# Patient Record
Sex: Male | Born: 1955
Health system: Southern US, Community
[De-identification: ages and names within clinical notes are randomized; demographics above are authoritative.]

## PROBLEM LIST (undated history)

## (undated) DIAGNOSIS — R071 Chest pain on breathing: Secondary | ICD-10-CM

## (undated) DIAGNOSIS — I351 Nonrheumatic aortic (valve) insufficiency: Secondary | ICD-10-CM

## (undated) DIAGNOSIS — N529 Male erectile dysfunction, unspecified: Secondary | ICD-10-CM

## (undated) DIAGNOSIS — E041 Nontoxic single thyroid nodule: Secondary | ICD-10-CM

## (undated) DIAGNOSIS — H6980 Other specified disorders of Eustachian tube, unspecified ear: Secondary | ICD-10-CM

## (undated) DIAGNOSIS — E039 Hypothyroidism, unspecified: Secondary | ICD-10-CM

## (undated) DIAGNOSIS — F17200 Nicotine dependence, unspecified, uncomplicated: Secondary | ICD-10-CM

## (undated) DIAGNOSIS — E7800 Pure hypercholesterolemia, unspecified: Secondary | ICD-10-CM

## (undated) DIAGNOSIS — H6990 Unspecified Eustachian tube disorder, unspecified ear: Secondary | ICD-10-CM

## (undated) DIAGNOSIS — I10 Essential (primary) hypertension: Secondary | ICD-10-CM

## (undated) DIAGNOSIS — J439 Emphysema, unspecified: Secondary | ICD-10-CM

## (undated) DIAGNOSIS — Z8619 Personal history of other infectious and parasitic diseases: Secondary | ICD-10-CM

## (undated) DIAGNOSIS — I7300 Raynaud's syndrome without gangrene: Secondary | ICD-10-CM

## (undated) DIAGNOSIS — R05 Cough: Secondary | ICD-10-CM

## (undated) DIAGNOSIS — R739 Hyperglycemia, unspecified: Secondary | ICD-10-CM

## (undated) HISTORY — DX: Personal history of other infectious and parasitic diseases: Z86.19

## (undated) HISTORY — DX: Raynaud's syndrome without gangrene: I73.00

## (undated) HISTORY — DX: Other specified disorders of Eustachian tube, unspecified ear: H69.80

## (undated) HISTORY — DX: Nicotine dependence, unspecified, uncomplicated: F17.200

## (undated) HISTORY — DX: Nontoxic single thyroid nodule: E04.1

## (undated) HISTORY — DX: Nonrheumatic aortic (valve) insufficiency: I35.1

## (undated) HISTORY — DX: Hyperglycemia, unspecified: R73.9

## (undated) HISTORY — DX: Emphysema, unspecified: J43.9

## (undated) HISTORY — DX: Hypothyroidism, unspecified: E03.9

## (undated) HISTORY — DX: Cough: R05

## (undated) HISTORY — DX: Chest pain on breathing: R07.1

## (undated) HISTORY — DX: Essential (primary) hypertension: I10

## (undated) HISTORY — DX: Pure hypercholesterolemia, unspecified: E78.00

## (undated) HISTORY — DX: Unspecified eustachian tube disorder, unspecified ear: H69.90

## (undated) HISTORY — DX: Male erectile dysfunction, unspecified: N52.9

---

## 1978-02-20 HISTORY — PX: NASAL SEPTUM SURGERY: SHX37

## 2003-11-16 ENCOUNTER — Encounter: Admission: RE | Admit: 2003-11-16 | Discharge: 2003-11-16 | Payer: Self-pay | Admitting: Internal Medicine

## 2004-03-02 ENCOUNTER — Ambulatory Visit: Payer: Self-pay | Admitting: Internal Medicine

## 2005-01-19 ENCOUNTER — Ambulatory Visit (HOSPITAL_COMMUNITY): Admission: RE | Admit: 2005-01-19 | Discharge: 2005-01-19 | Payer: Self-pay | Admitting: Internal Medicine

## 2005-01-27 ENCOUNTER — Ambulatory Visit: Payer: Self-pay | Admitting: Endocrinology

## 2005-02-06 ENCOUNTER — Other Ambulatory Visit: Admission: RE | Admit: 2005-02-06 | Discharge: 2005-02-06 | Payer: Self-pay | Admitting: Interventional Radiology

## 2005-02-06 ENCOUNTER — Encounter: Admission: RE | Admit: 2005-02-06 | Discharge: 2005-02-06 | Payer: Self-pay | Admitting: Endocrinology

## 2005-02-06 ENCOUNTER — Encounter (INDEPENDENT_AMBULATORY_CARE_PROVIDER_SITE_OTHER): Payer: Self-pay | Admitting: Specialist

## 2005-02-16 ENCOUNTER — Ambulatory Visit: Payer: Self-pay | Admitting: Endocrinology

## 2005-03-03 ENCOUNTER — Ambulatory Visit: Payer: Self-pay | Admitting: Endocrinology

## 2005-03-09 ENCOUNTER — Ambulatory Visit: Payer: Self-pay | Admitting: Endocrinology

## 2005-03-23 HISTORY — PX: THYROID LOBECTOMY: SHX420

## 2005-03-27 ENCOUNTER — Ambulatory Visit: Payer: Self-pay | Admitting: Internal Medicine

## 2005-06-06 ENCOUNTER — Ambulatory Visit: Payer: Self-pay | Admitting: Endocrinology

## 2005-12-20 ENCOUNTER — Ambulatory Visit: Payer: Self-pay | Admitting: Endocrinology

## 2005-12-20 LAB — CONVERTED CEMR LAB
ALT: 13 units/L (ref 0–40)
AST: 22 units/L (ref 0–37)
Albumin: 3.9 g/dL (ref 3.5–5.2)
Alkaline Phosphatase: 97 units/L (ref 39–117)
BUN: 11 mg/dL (ref 6–23)
Bacteria, U Microscopic: NEGATIVE /hpf
Basophils Absolute: 0.1 10*3/uL (ref 0.0–0.1)
Basophils Relative: 0.6 % (ref 0.0–1.0)
Bilirubin Urine: NEGATIVE
CO2: 28 meq/L (ref 19–32)
Calcium: 9.6 mg/dL (ref 8.4–10.5)
Chloride: 103 meq/L (ref 96–112)
Chol/HDL Ratio, serum: 8
Cholesterol: 227 mg/dL (ref 0–200)
Creatinine, Ser: 1.1 mg/dL (ref 0.4–1.5)
Crystals: NEGATIVE
Eosinophil percent: 2.9 % (ref 0.0–5.0)
Epithelial cells, urine: NEGATIVE /lpf
GFR calc non Af Amer: 75 mL/min
Glomerular Filtration Rate, Af Am: 91 mL/min/{1.73_m2}
Glucose, Bld: 104 mg/dL — ABNORMAL HIGH (ref 70–99)
HCT: 51.6 % (ref 39.0–52.0)
HDL: 28.5 mg/dL — ABNORMAL LOW (ref >39.0)
Hemoglobin, Urine: NEGATIVE
Hemoglobin: 17.2 g/dL — ABNORMAL HIGH (ref 13.0–17.0)
Ketones, ur: NEGATIVE mg/dL
LDL DIRECT: 176.6 mg/dL
Lymphocytes Relative: 26.1 % (ref 12.0–46.0)
MCHC: 33.3 g/dL (ref 30.0–36.0)
MCV: 97.2 fL (ref 78.0–100.0)
Monocytes Absolute: 0.9 10*3/uL — ABNORMAL HIGH (ref 0.2–0.7)
Monocytes Relative: 8.3 % (ref 3.0–11.0)
Mucus, UA: NEGATIVE
Neutro Abs: 6.5 10*3/uL (ref 1.4–7.7)
Neutrophils Relative %: 62.1 % (ref 43.0–77.0)
Nitrite: NEGATIVE
PSA: 0.73 ng/mL (ref 0.10–4.00)
Platelets: 259 10*3/uL (ref 150–400)
Potassium: 4.7 meq/L (ref 3.5–5.1)
RBC / HPF: NONE SEEN
RBC: 5.3 M/uL (ref 4.22–5.81)
RDW: 12.6 % (ref 11.5–14.6)
Sodium: 139 meq/L (ref 135–145)
Specific Gravity, Urine: 1.015 (ref 1.000–1.03)
TSH: 5.29 microintl units/mL (ref 0.35–5.50)
Total Bilirubin: 0.8 mg/dL (ref 0.3–1.2)
Total Protein, Urine: NEGATIVE mg/dL
Total Protein: 7.5 g/dL (ref 6.0–8.3)
Triglyceride fasting, serum: 127 mg/dL (ref 0–149)
Urine Glucose: NEGATIVE mg/dL
Urobilinogen, UA: 0.2 (ref 0.0–1.0)
VLDL: 25 mg/dL (ref 0–40)
WBC: 10.5 10*3/uL (ref 4.5–10.5)
pH: 7 (ref 5.0–8.0)

## 2005-12-27 ENCOUNTER — Ambulatory Visit: Payer: Self-pay | Admitting: Endocrinology

## 2006-01-10 ENCOUNTER — Ambulatory Visit: Payer: Self-pay

## 2006-01-10 ENCOUNTER — Encounter: Payer: Self-pay | Admitting: Cardiology

## 2006-02-07 ENCOUNTER — Ambulatory Visit: Payer: Self-pay | Admitting: Endocrinology

## 2006-02-07 LAB — CONVERTED CEMR LAB
ALT: 15 units/L (ref 0–40)
AST: 21 units/L (ref 0–37)
Albumin: 3.6 g/dL (ref 3.5–5.2)
Alkaline Phosphatase: 97 units/L (ref 39–117)
Bilirubin, Direct: 0.2 mg/dL (ref 0.0–0.3)
Chol/HDL Ratio, serum: 6.9
Cholesterol: 231 mg/dL (ref 0–200)
HDL: 33.5 mg/dL — ABNORMAL LOW (ref >39.0)
LDL DIRECT: 176 mg/dL
Testosterone, total: 7.8366 ng/mL
Total Bilirubin: 1.1 mg/dL (ref 0.3–1.2)
Total Protein: 7.3 g/dL (ref 6.0–8.3)
Triglyceride fasting, serum: 164 mg/dL — ABNORMAL HIGH (ref 0–149)
VLDL: 33 mg/dL (ref 0–40)

## 2006-03-27 ENCOUNTER — Ambulatory Visit: Payer: Self-pay | Admitting: Endocrinology

## 2006-03-27 LAB — CONVERTED CEMR LAB
Cholesterol: 157 mg/dL (ref 0–200)
HDL: 33.1 mg/dL — ABNORMAL LOW (ref >39.0)
Hgb A1c MFr Bld: 5.7 % (ref 4.6–6.0)
LDL Cholesterol: 94 mg/dL (ref 0–99)
TSH: 2.92 microintl units/mL (ref 0.35–5.50)
Total CHOL/HDL Ratio: 4.7
Triglycerides: 152 mg/dL — ABNORMAL HIGH (ref 0–149)
VLDL: 30 mg/dL (ref 0–40)

## 2006-04-20 ENCOUNTER — Ambulatory Visit: Payer: Self-pay | Admitting: Endocrinology

## 2006-09-14 ENCOUNTER — Encounter: Payer: Self-pay | Admitting: Endocrinology

## 2006-09-14 DIAGNOSIS — I10 Essential (primary) hypertension: Secondary | ICD-10-CM | POA: Insufficient documentation

## 2006-09-14 HISTORY — DX: Essential (primary) hypertension: I10

## 2006-09-28 ENCOUNTER — Ambulatory Visit: Payer: Self-pay | Admitting: Endocrinology

## 2006-10-01 ENCOUNTER — Encounter: Payer: Self-pay | Admitting: Endocrinology

## 2006-10-01 LAB — CONVERTED CEMR LAB
Catecholamines Tot(E+NE) 24 Hr U: 0.078 mg/24hr
Metaneph Total, Ur: 632 ug/24hr (ref 90–690)
Norepinephrine 24 Hr Urine: 69 mcg/24hr (ref <80)
Normetanephrine, 24H Ur: 471 (ref 44–540)

## 2006-12-19 ENCOUNTER — Encounter: Payer: Self-pay | Admitting: Endocrinology

## 2007-01-16 ENCOUNTER — Ambulatory Visit: Payer: Self-pay | Admitting: Endocrinology

## 2007-01-16 DIAGNOSIS — E7800 Pure hypercholesterolemia, unspecified: Secondary | ICD-10-CM

## 2007-01-16 DIAGNOSIS — R071 Chest pain on breathing: Secondary | ICD-10-CM | POA: Insufficient documentation

## 2007-01-16 HISTORY — DX: Chest pain on breathing: R07.1

## 2007-01-16 HISTORY — DX: Pure hypercholesterolemia, unspecified: E78.00

## 2007-05-13 ENCOUNTER — Encounter: Payer: Self-pay | Admitting: Endocrinology

## 2007-08-12 ENCOUNTER — Encounter: Payer: Self-pay | Admitting: Endocrinology

## 2007-11-07 ENCOUNTER — Ambulatory Visit: Payer: Self-pay | Admitting: Endocrinology

## 2007-11-07 DIAGNOSIS — R059 Cough, unspecified: Secondary | ICD-10-CM

## 2007-11-07 DIAGNOSIS — E041 Nontoxic single thyroid nodule: Secondary | ICD-10-CM

## 2007-11-07 DIAGNOSIS — R05 Cough: Secondary | ICD-10-CM

## 2007-11-07 HISTORY — DX: Cough, unspecified: R05.9

## 2007-11-07 HISTORY — DX: Nontoxic single thyroid nodule: E04.1

## 2007-11-07 LAB — CONVERTED CEMR LAB
ALT: 18 units/L (ref 0–53)
AST: 24 units/L (ref 0–37)
Albumin: 4.1 g/dL (ref 3.5–5.2)
Alkaline Phosphatase: 110 units/L (ref 39–117)
BUN: 11 mg/dL (ref 6–23)
Bilirubin, Direct: 0.1 mg/dL (ref 0.0–0.3)
CO2: 31 meq/L (ref 19–32)
Calcium: 9.5 mg/dL (ref 8.4–10.5)
Chloride: 100 meq/L (ref 96–112)
Cholesterol: 209 mg/dL (ref 0–200)
Creatinine, Ser: 1 mg/dL (ref 0.4–1.5)
Direct LDL: 203 mg/dL
GFR calc Af Amer: 101 mL/min
GFR calc non Af Amer: 83 mL/min
Glucose, Bld: 97 mg/dL (ref 70–99)
HDL: 28.4 mg/dL — ABNORMAL LOW (ref >39.0)
PSA: 0.78 ng/mL (ref 0.10–4.00)
Potassium: 4.7 meq/L (ref 3.5–5.1)
Sodium: 139 meq/L (ref 135–145)
TSH: 3.45 microintl units/mL (ref 0.35–5.50)
Total Bilirubin: 0.6 mg/dL (ref 0.3–1.2)
Total CHOL/HDL Ratio: 7.4
Total Protein: 8 g/dL (ref 6.0–8.3)
Triglycerides: 167 mg/dL — ABNORMAL HIGH (ref 0–149)
VLDL: 33 mg/dL (ref 0–40)

## 2007-12-15 IMAGING — US US BIOPSY
1 series · 5 of 5 positions shown · non-contrast
Comparison: none

CLINICAL DATA: Palpable left thyroid nodule.  Request has been made for fine needle aspiration. 
 ULTRASOUND GUIDED FINE NEEDLE ASPIRATION, LEFT LOBE OF THYROID:

[Series 1: unknown · 0.06mm/px · 5 of 5 slices shown]
[im 1/5]
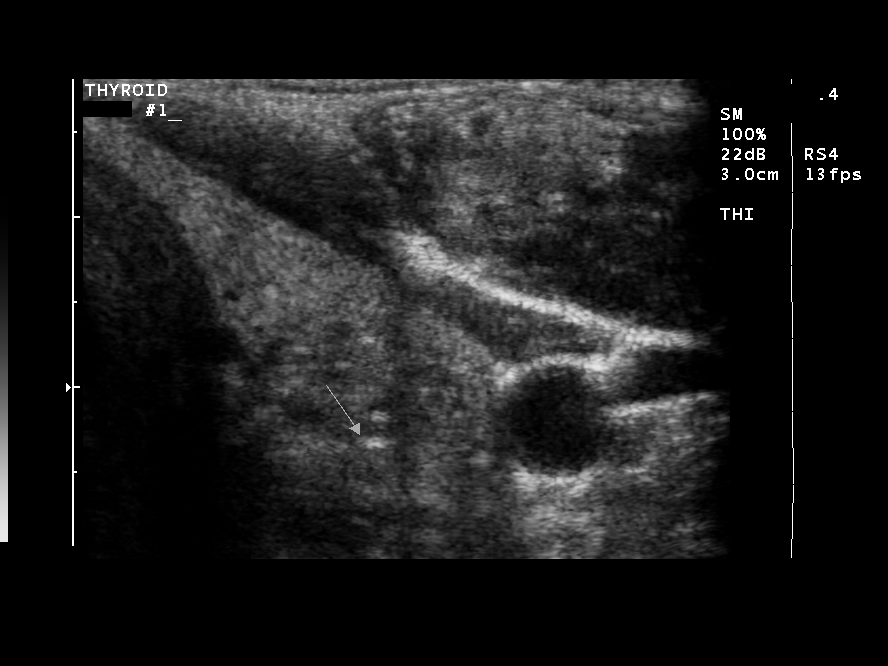
[im 2/5]
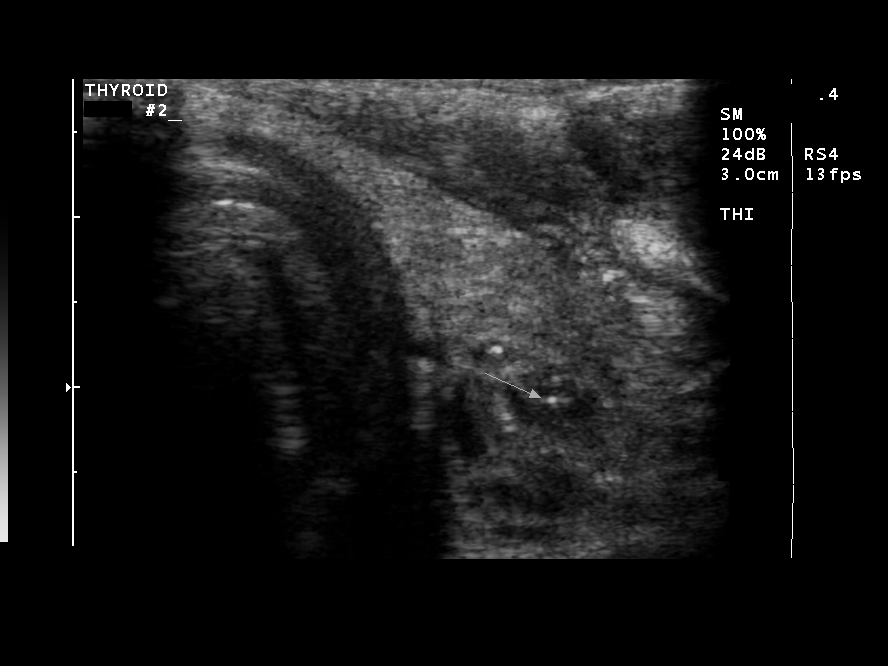
[im 3/5]
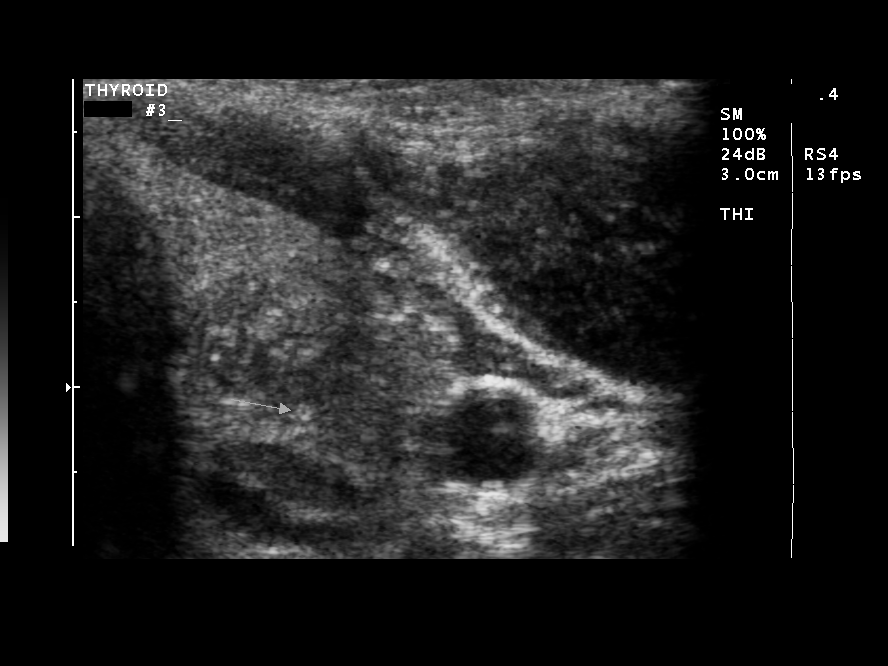
[im 4/5]
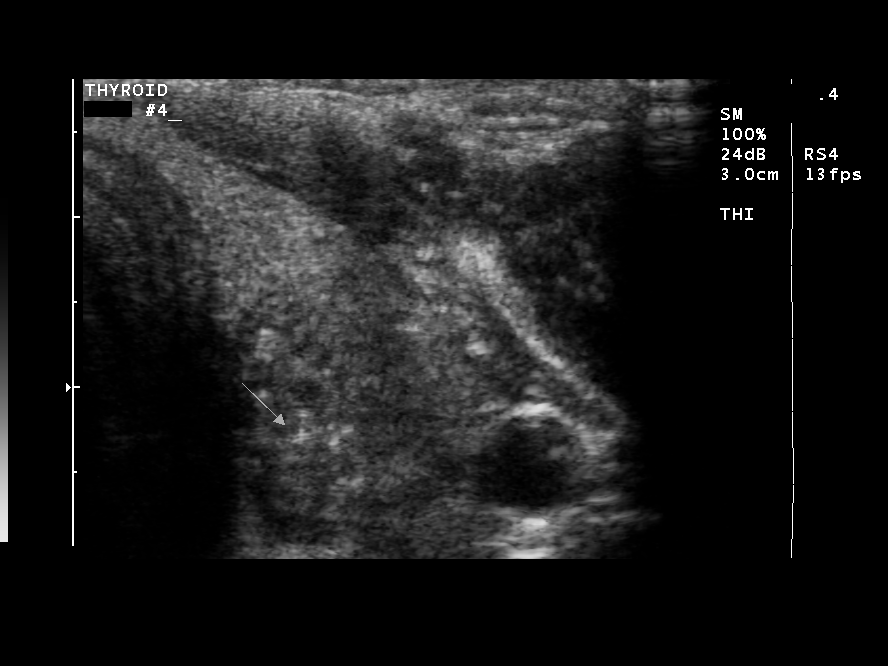
[im 5/5]
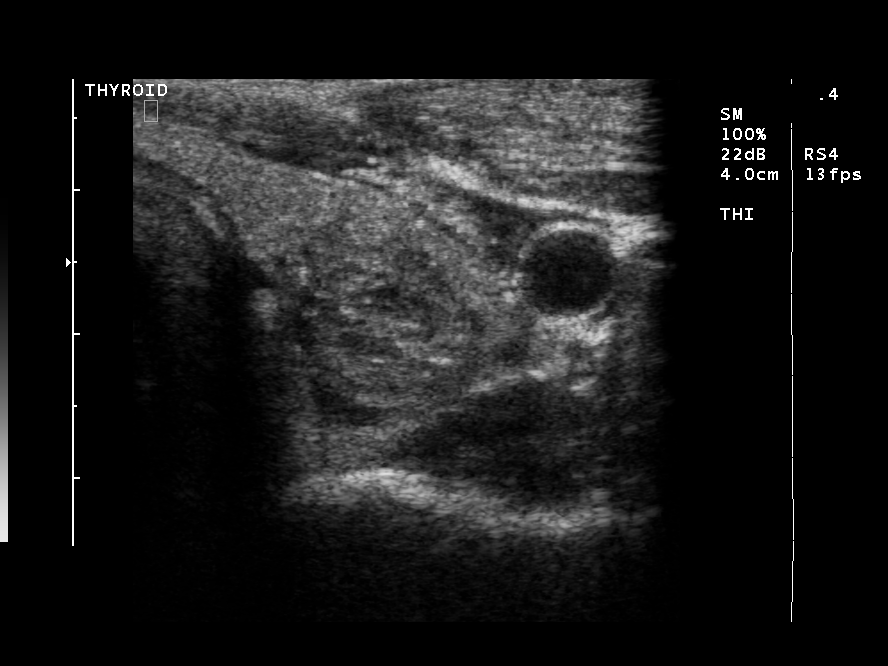

[5 of 5 positions shown; findings below may reference images not displayed]

FINDINGS: The above procedure was thoroughly discussed with the patient and written informed consent was obtained.
 Ultrasound was then performed to localize and mark an adequate site for the biopsy.  The patient was then prepped and draped in a normal sterile fashion, 1% Lidocaine was used for local anesthesia.  Using direct ultrasound guidance, four passes were made using a 25 gauge hypodermic needle into the nodule located within the mid portion of left lobe of the thyroid.  Ultrasound confirmed placement of the needle on all four occasions.  The specimens were given to pathology for further analysis.  Post procedure imaging demonstrated no hematoma or immediate complication. The patient tolerated the procedure well.
IMPRESSION: Successful ultrasound guided fine needle aspiration, nodule mid portion left lobe of the thyroid.  Final pathology pending.

## 2007-12-31 ENCOUNTER — Encounter: Payer: Self-pay | Admitting: Endocrinology

## 2008-01-03 ENCOUNTER — Ambulatory Visit: Payer: Self-pay | Admitting: Endocrinology

## 2008-01-29 ENCOUNTER — Encounter: Payer: Self-pay | Admitting: Endocrinology

## 2008-10-06 ENCOUNTER — Ambulatory Visit: Payer: Self-pay | Admitting: Endocrinology

## 2008-10-06 DIAGNOSIS — F17200 Nicotine dependence, unspecified, uncomplicated: Secondary | ICD-10-CM

## 2008-10-06 HISTORY — DX: Nicotine dependence, unspecified, uncomplicated: F17.200

## 2009-10-06 ENCOUNTER — Ambulatory Visit: Payer: Self-pay | Admitting: Endocrinology

## 2009-10-07 LAB — CONVERTED CEMR LAB
ALT: 15 units/L (ref 0–53)
AST: 18 units/L (ref 0–37)
Albumin: 3.8 g/dL (ref 3.5–5.2)
Alkaline Phosphatase: 136 units/L — ABNORMAL HIGH (ref 39–117)
BUN: 11 mg/dL (ref 6–23)
Basophils Absolute: 0.1 10*3/uL (ref 0.0–0.1)
Basophils Relative: 0.4 % (ref 0.0–3.0)
Bilirubin Urine: NEGATIVE
CO2: 27 meq/L (ref 19–32)
Calcium: 9.4 mg/dL (ref 8.4–10.5)
Chloride: 103 meq/L (ref 96–112)
Cholesterol: 216 mg/dL — ABNORMAL HIGH (ref 0–200)
Creatinine, Ser: 0.9 mg/dL (ref 0.4–1.5)
Direct LDL: 173.2 mg/dL
Eosinophils Absolute: 0.3 10*3/uL (ref 0.0–0.7)
GFR calc non Af Amer: 92.24 mL/min (ref >60)
Glucose, Bld: 74 mg/dL (ref 70–99)
Hemoglobin, Urine: NEGATIVE
Hemoglobin: 16.2 g/dL (ref 13.0–17.0)
Ketones, ur: NEGATIVE mg/dL
Lymphocytes Relative: 28.2 % (ref 12.0–46.0)
Lymphs Abs: 3.5 10*3/uL (ref 0.7–4.0)
Monocytes Absolute: 0.9 10*3/uL (ref 0.1–1.0)
Monocytes Relative: 7.2 % (ref 3.0–12.0)
Neutro Abs: 7.8 10*3/uL — ABNORMAL HIGH (ref 1.4–7.7)
Neutrophils Relative %: 61.8 % (ref 43.0–77.0)
Nitrite: NEGATIVE
PSA: 0.64 ng/mL (ref 0.10–4.00)
Potassium: 4.7 meq/L (ref 3.5–5.1)
RBC: 4.84 M/uL (ref 4.22–5.81)
Specific Gravity, Urine: 1.01 (ref 1.000–1.030)
Total Bilirubin: 0.7 mg/dL (ref 0.3–1.2)
Total CHOL/HDL Ratio: 8
Total Protein, Urine: NEGATIVE mg/dL
Total Protein: 7.2 g/dL (ref 6.0–8.3)
Triglycerides: 153 mg/dL — ABNORMAL HIGH (ref 0.0–149.0)
Urine Glucose: NEGATIVE mg/dL
Urobilinogen, UA: 0.2 (ref 0.0–1.0)
VLDL: 30.6 mg/dL (ref 0.0–40.0)
pH: 6.5 (ref 5.0–8.0)

## 2009-10-12 ENCOUNTER — Ambulatory Visit: Payer: Self-pay | Admitting: Endocrinology

## 2010-03-20 LAB — CONVERTED CEMR LAB
ALT: 15 units/L (ref 0–53)
AST: 21 units/L (ref 0–37)
Albumin: 3.8 g/dL (ref 3.5–5.2)
Alkaline Phosphatase: 118 units/L — ABNORMAL HIGH (ref 39–117)
BUN: 13 mg/dL (ref 6–23)
Basophils Absolute: 0 10*3/uL (ref 0.0–0.1)
Basophils Relative: 0.3 % (ref 0.0–3.0)
Bilirubin, Direct: 0.1 mg/dL (ref 0.0–0.3)
CO2: 30 meq/L (ref 19–32)
Calcium: 9.2 mg/dL (ref 8.4–10.5)
Chloride: 106 meq/L (ref 96–112)
Cholesterol: 223 mg/dL — ABNORMAL HIGH (ref 0–200)
Creatinine, Ser: 0.9 mg/dL (ref 0.4–1.5)
Direct LDL: 174 mg/dL
Eosinophils Absolute: 0.3 10*3/uL (ref 0.0–0.7)
Eosinophils Relative: 2.3 % (ref 0.0–5.0)
GFR calc non Af Amer: 93.78 mL/min (ref >60)
Glucose, Bld: 92 mg/dL (ref 70–99)
HCT: 48.1 % (ref 39.0–52.0)
HDL: 26 mg/dL — ABNORMAL LOW (ref >39.00)
Hemoglobin: 16.3 g/dL (ref 13.0–17.0)
Lymphocytes Relative: 27.4 % (ref 12.0–46.0)
Lymphs Abs: 3.8 10*3/uL (ref 0.7–4.0)
MCHC: 34 g/dL (ref 30.0–36.0)
MCV: 97.7 fL (ref 78.0–100.0)
Monocytes Absolute: 0.7 10*3/uL (ref 0.1–1.0)
Monocytes Relative: 5 % (ref 3.0–12.0)
Neutro Abs: 9 10*3/uL — ABNORMAL HIGH (ref 1.4–7.7)
Neutrophils Relative %: 65 % (ref 43.0–77.0)
PSA: 0.7 ng/mL (ref 0.10–4.00)
Platelets: 238 10*3/uL (ref 150.0–400.0)
Potassium: 4.3 meq/L (ref 3.5–5.1)
RBC: 4.92 M/uL (ref 4.22–5.81)
RDW: 13.1 % (ref 11.5–14.6)
Sodium: 141 meq/L (ref 135–145)
TSH: 2.23 microintl units/mL (ref 0.35–5.50)
Total Bilirubin: 0.8 mg/dL (ref 0.3–1.2)
Total CHOL/HDL Ratio: 9
Total Protein: 7.7 g/dL (ref 6.0–8.3)
Triglycerides: 199 mg/dL — ABNORMAL HIGH (ref 0.0–149.0)
VLDL: 39.8 mg/dL (ref 0.0–40.0)
WBC: 13.8 10*3/uL — ABNORMAL HIGH (ref 4.5–10.5)

## 2010-03-23 NOTE — Assessment & Plan Note (Signed)
Summary: CPX / NWS  #   Vital Signs:  Patient profile:   55 year old male Height:      72 inches (182.88 cm) Weight:      174.25 pounds (79.20 kg) BMI:     23.72 O2 Sat:      96 % on Room air Temp:     96.8 degrees F (36 degrees C) oral Pulse rate:   72 / minute BP sitting:   130 / 80  (left arm) Cuff size:   regular  Vitals Entered By: Brenton Grills MA (October 12, 2009 9:38 AM)  O2 Flow:  Room air CC: Physical/pt is not currently taking Allegra D, Colestipol, Simvastatin or Flonase/aj   CC:  Physical/pt is not currently taking Allegra D, Colestipol, and Simvastatin or Flonase/aj.  History of Present Illness: here for regular wellness examination.  He's feeling pretty well in general. pt says his insurance has a vey high deductible, but cpx is not subject that deductible.  he does not want to take cholesterol medication, out of fear of side-effects.    Current Medications (verified): 1)  Multivitamins   Tabs (Multiple Vitamin) .... Take 1 By Mouth Qd 2)  Flonase 50 Mcg/act  Susp (Fluticasone Propionate) .... Use Prn 3)  Synthroid 50 Mcg  Tabs (Levothyroxine Sodium) .... Qd 4)  Norvasc 5 Mg  Tabs (Amlodipine Besylate) .... Take 1 By Mouth Qd 5)  Allegra-D 12 Hour 60-120 Mg Xr12h-Tab (Fexofenadine-Pseudoephedrine) .... Two Times A Day As Needed Congestion 6)  Viagra 100 Mg Tabs (Sildenafil Citrate) .... As Needed Use 7)  Colestipol Hcl 1 Gm Tabs (Colestipol Hcl) .... 5 Qd 8)  Simvastatin 40 Mg Tabs (Simvastatin) .... Take 1 By Mouth Qd 9)  Niacor 500 Mg Tabs (Niacin) .Marland Kitchen.. 1 By Mouth Once Daily 10)  Fish Oil 1000 Mg Caps (Omega-3 Fatty Acids) .Marland Kitchen.. 1 By Mouth Once Daily  Allergies (verified): 1)  ! Ace Inhibitors  Past History:  Past Medical History: Last updated: 09/14/2006 Hypertension Hyperglycemia Bilateral ear tubes for eutachians valve dysfunction Hx scarlett fever Smoker Raynaids's phenom  Family History: Reviewed history from 10/06/2008 and no changes  required. no cancer  Social History: Reviewed history from 10/06/2008 and no changes required. unemployed divorced alcohol: 1-4/weekend smokes cigars  Review of Systems  The patient denies fever, weight loss, weight gain, vision loss, decreased hearing, chest pain, syncope, dyspnea on exertion, prolonged cough, headaches, abdominal pain, melena, hematochezia, severe indigestion/heartburn, hematuria, suspicious skin lesions, and depression.         chronic nasal congestion--declines eval of this.    Physical Exam  General:  normal appearance.   Head:  head: no deformity eyes: no periorbital swelling, no proptosis external nose and ears are normal mouth: no lesion seen Neck:  Supple without thyroid enlargement or tenderness.  Lungs:  Clear to auscultation bilaterally. Normal respiratory effort.  Heart:  Regular rate and rhythm without murmurs or gallops noted. Normal S1,S2.   Abdomen:  abdomen is soft, nontender.  no hepatosplenomegaly.   not distended.  no hernia  Rectal:  normal external and internal exam.  heme neg  Prostate:  Normal size prostate without masses or tenderness.  Msk:  muscle bulk and strength are grossly normal.  no obvious joint swelling.  gait is normal and steady  Pulses:  dorsalis pedis intact bilat.  no carotid bruit  Extremities:  no deformity.  no ulcer on the feet.  feet are of normal color and temp.  no edema  Neurologic:  cn 2-12 grossly intact.   readily moves all 4's.   sensation is intact to touch on the feet Skin:  normal texture and temp.  no rash.  not diaphoretic  Cervical Nodes:  No significant adenopathy.  Psych:  Alert and cooperative; normal mood and affect; normal attention span and concentration.     Impression & Recommendations:  Problem # 1:  ROUTINE GENERAL MEDICAL EXAM@HEALTH  CARE FACL (ICD-V70.0)  Problem # 2:  pulsatile mass ? aaa  Medications Added to Medication List This Visit: 1)  Niacor 500 Mg Tabs (Niacin) .Marland Kitchen.. 1  by mouth once daily 2)  Fish Oil 1000 Mg Caps (Omega-3 fatty acids) .Marland Kitchen.. 1 by mouth once daily 3)  Amlodipine Besylate 10 Mg Tabs (Amlodipine besylate) .... 1/2 tab once daily  Other Orders: Est. Patient 40-64 years ([id number])  Patient Instructions: 1)  please consider these measures for your health:  minimize alcohol.  do not use tobacco products.  have a colonoscopy at least every 10 years from age 12.  keep firearms safely stored.  always use seat belts.  have working smoke alarms in your home.  see an eye doctor and dentist regularly.  never drive under the influence of alcohol or drugs (including prescription drugs).  those with fair skin should take precautions against the sun. 2)  let me know if you decide to take cholesterol medication. 3)  given you lack of insurance coverage, you may wish to consider rechecking alkaline phosphatase rather than doing further tests to evaluate the abnortmality now. 4)  you should have an ultrasound to see if you have an aneurysm in your abdomen.  let me know if you wnat to schedule this. 5)  Please schedule a follow-up appointment in 1 year. Prescriptions: VIAGRA 100 MG TABS (SILDENAFIL CITRATE) as needed use  #12 x 11   Entered and Authorized by:   Minus Breeding MD   Signed by:   Minus Breeding MD on 10/12/2009   Method used:   Print then Give to Patient   RxID:   [id number] AMLODIPINE BESYLATE 10 MG TABS (AMLODIPINE BESYLATE) 1/2 tab once daily  #90 x 1   Entered and Authorized by:   Minus Breeding MD   Signed by:   Minus Breeding MD on 10/12/2009   Method used:   Print then Give to Patient   RxID:   [id number] VIAGRA 100 MG TABS (SILDENAFIL CITRATE) as needed use  #10 x 11   Entered and Authorized by:   Minus Breeding MD   Signed by:   Minus Breeding MD on 10/12/2009   Method used:   Print then Give to Patient   RxID:   [id number] SYNTHROID 50 MCG  TABS (LEVOTHYROXINE SODIUM) qd  #90 x 3   Entered and Authorized by:   Minus Breeding MD   Signed by:   Minus Breeding MD on 10/12/2009   Method used:   Print then Give to Patient   RxID:   [id number]

## 2010-10-10 ENCOUNTER — Ambulatory Visit: Payer: Self-pay

## 2010-10-10 DIAGNOSIS — Z0000 Encounter for general adult medical examination without abnormal findings: Secondary | ICD-10-CM

## 2010-10-10 DIAGNOSIS — Z0389 Encounter for observation for other suspected diseases and conditions ruled out: Secondary | ICD-10-CM

## 2010-10-10 LAB — CBC WITH DIFFERENTIAL/PLATELET
Basophils Absolute: 0.1 10*3/uL (ref 0.0–0.1)
Basophils Relative: 0.5 % (ref 0.0–3.0)
Eosinophils Absolute: 0.3 10*3/uL (ref 0.0–0.7)
Eosinophils Relative: 2.8 % (ref 0.0–5.0)
HCT: 49.7 % (ref 39.0–52.0)
Hemoglobin: 16.6 g/dL (ref 13.0–17.0)
Lymphocytes Relative: 28.2 % (ref 12.0–46.0)
Lymphs Abs: 3.2 10*3/uL (ref 0.7–4.0)
MCHC: 33.5 g/dL (ref 30.0–36.0)
MCV: 98.9 fl (ref 78.0–100.0)
Monocytes Absolute: 0.7 10*3/uL (ref 0.1–1.0)
Monocytes Relative: 6.1 % (ref 3.0–12.0)
Neutro Abs: 7.1 10*3/uL (ref 1.4–7.7)
Neutrophils Relative %: 62.4 % (ref 43.0–77.0)
Platelets: 257 10*3/uL (ref 150.0–400.0)
RBC: 5.02 Mil/uL (ref 4.22–5.81)
RDW: 14.4 % (ref 11.5–14.6)
WBC: 11.4 10*3/uL — ABNORMAL HIGH (ref 4.5–10.5)

## 2010-10-10 LAB — URINALYSIS
Nitrite: NEGATIVE
Specific Gravity, Urine: 1.01 (ref 1.000–1.030)
Total Protein, Urine: NEGATIVE
Urine Glucose: NEGATIVE
Urobilinogen, UA: 0.2 (ref 0.0–1.0)
pH: 6.5 (ref 5.0–8.0)

## 2010-10-10 LAB — HEPATIC FUNCTION PANEL
ALT: 16 U/L (ref 0–53)
Alkaline Phosphatase: 120 U/L — ABNORMAL HIGH (ref 39–117)
Bilirubin, Direct: 0.1 mg/dL (ref 0.0–0.3)
Total Bilirubin: 0.7 mg/dL (ref 0.3–1.2)
Total Protein: 7.3 g/dL (ref 6.0–8.3)

## 2010-10-10 LAB — BASIC METABOLIC PANEL
BUN: 15 mg/dL (ref 6–23)
CO2: 27 mEq/L (ref 19–32)
Calcium: 9.2 mg/dL (ref 8.4–10.5)
Chloride: 102 mEq/L (ref 96–112)
Creatinine, Ser: 1 mg/dL (ref 0.4–1.5)
GFR: 86.4 mL/min (ref >60.00)
Sodium: 138 mEq/L (ref 135–145)

## 2010-10-10 LAB — LIPID PANEL
HDL: 39.3 mg/dL (ref >39.00)
Total CHOL/HDL Ratio: 5
Triglycerides: 126 mg/dL (ref 0.0–149.0)
VLDL: 25.2 mg/dL (ref 0.0–40.0)

## 2010-10-10 LAB — LDL CHOLESTEROL, DIRECT: Direct LDL: 156.1 mg/dL

## 2010-10-10 LAB — PSA: PSA: 0.49 ng/mL (ref 0.10–4.00)

## 2010-10-17 ENCOUNTER — Ambulatory Visit (INDEPENDENT_AMBULATORY_CARE_PROVIDER_SITE_OTHER): Payer: BC Managed Care – PPO | Admitting: Endocrinology

## 2010-10-17 ENCOUNTER — Other Ambulatory Visit: Payer: Self-pay | Admitting: Endocrinology

## 2010-10-17 ENCOUNTER — Encounter: Payer: Self-pay | Admitting: Endocrinology

## 2010-10-17 ENCOUNTER — Telehealth: Payer: Self-pay | Admitting: *Deleted

## 2010-10-17 ENCOUNTER — Other Ambulatory Visit: Payer: BC Managed Care – PPO

## 2010-10-17 VITALS — BP 128/80 | HR 67 | Temp 97.8°F | Ht 72.0 in | Wt 164.4 lb

## 2010-10-17 DIAGNOSIS — Z0001 Encounter for general adult medical examination with abnormal findings: Secondary | ICD-10-CM | POA: Insufficient documentation

## 2010-10-17 DIAGNOSIS — Z0000 Encounter for general adult medical examination without abnormal findings: Secondary | ICD-10-CM

## 2010-10-17 DIAGNOSIS — K769 Liver disease, unspecified: Secondary | ICD-10-CM

## 2010-10-17 MED ORDER — SILDENAFIL CITRATE 100 MG PO TABS: 100.0000 mg | ORAL_TABLET | Freq: Every day | ORAL | Status: DC | PRN

## 2010-10-17 MED ORDER — LEVOTHYROXINE SODIUM 50 MCG PO TABS: 50.0000 ug | ORAL_TABLET | Freq: Every day | ORAL | Status: DC

## 2010-10-17 MED ORDER — AMLODIPINE BESYLATE 10 MG PO TABS: 10.0000 mg | ORAL_TABLET | Freq: Every day | ORAL | Status: DC

## 2010-10-17 MED ORDER — AMLODIPINE BESYLATE 10 MG PO TABS: 5.0000 mg | ORAL_TABLET | Freq: Every day | ORAL | Status: DC

## 2010-10-17 NOTE — Telephone Encounter (Signed)
Pharmacist at [facility] informed

## 2010-10-17 NOTE — Patient Instructions (Addendum)
blood tests are being requested for you today.  please call [phone] to hear your test results.  You will be prompted to enter the 9-digit "MRN" number that appears at the top left of this page, followed by #.  Then you will hear the message. Please return in 1 year please consider these measures for your health:  minimize alcohol.  do not use tobacco products.  have a colonoscopy at least every 10 years from age 55.  keep firearms safely stored.  always use seat belts.  have working smoke alarms in your home.  see an eye doctor and dentist regularly.  never drive under the influence of alcohol or drugs (including prescription drugs).  those with fair skin should take precautions against the sun. please let me know what your wishes would be, if artificial life support measures should become necessary.  it is critically important to prevent falling down (keep floor areas well-lit, dry, and free of loose objects).

## 2010-10-17 NOTE — Telephone Encounter (Signed)
Should be 1/2 qd.  i changed and resent

## 2010-10-17 NOTE — Progress Notes (Signed)
Subjective:    Patient ID: Frederick Scott, male    DOB: Jun 20, 1955, 55 y.o.   MRN: [id number]  HPI here for regular wellness examination.  He smokes cigars.  He's feeling pretty well in general, except for slight bilat tinnitus.  and says chronic med probs are stable, except as noted below.  He declines ecg.  He does not want to take chol med, out of fear of side-effects. Past Medical History  Diagnosis Date  . THYROID NODULE, LEFT 11/07/2007  . HYPERCHOLESTEROLEMIA 01/16/2007  . HYPERTENSION 09/14/2006  . Cough 11/07/2007  . CHEST PAIN, PLEURITIC 01/16/2007  . SMOKER 10/06/2008  . Hyperglycemia   . History of scarlet fever   . Raynaud's phenomenon   . Eustachian tube dysfunction     Bilateral ear tubs for Eustachian Valve Dysfunction    Past Surgical History  Procedure Date  . Nasal septum surgery 1980  . Thyroid lobectomy 03/2005    left    History   Social History  . Marital Status: Single    Spouse Name: N/A    Number of Children: N/A  . Years of Education: N/A   Occupational History  . Unemployed    Social History Main Topics  . Smoking status: Current Everyday Smoker    Types: Cigars  . Smokeless tobacco: Not on file  . Alcohol Use: Yes     1-4/weekend  . Drug Use:   . Sexually Active:    Other Topics Concern  . Not on file   Social History Narrative   Divorced    Current Outpatient Prescriptions on File Prior to Visit  Medication Sig Dispense Refill  . amLODipine (NORVASC) 10 MG tablet 1/2 tablet once daily       . fish oil-omega-3 fatty acids 1000 MG capsule Take 1 capsule by mouth daily.        . Multiple Vitamin (MULTIVITAMIN) tablet Take 1 tablet by mouth daily.        . colestipol (COLESTID) 1 G tablet Take 5 g by mouth daily.        . niacin (NIACOR) 500 MG tablet Take 500 mg by mouth daily.          Allergies  Allergen Reactions  . Ace Inhibitors     Family History  Problem Relation Age of Onset  . Cancer Neg Hx     BP 128/80  Pulse 67   Temp(Src) 97.8 F (36.6 C) (Oral)  Ht 6' (1.829 m)  Wt 164 lb 6.4 oz (74.571 kg)  BMI 22.30 kg/m2  SpO2 96%   Review of Systems  Constitutional: Negative for fever.  HENT: Positive for tinnitus. Negative for hearing loss.   Eyes: Negative for visual disturbance.  Respiratory: Negative for shortness of breath.   Cardiovascular: Negative for chest pain.  Gastrointestinal: Negative for anal bleeding.  Genitourinary: Negative for hematuria.  Musculoskeletal: Negative for arthralgias.  Skin: Negative for rash.  Neurological: Negative for syncope.  Hematological: Does not bruise/bleed easily.  Psychiatric/Behavioral: Negative for dysphoric mood. The patient is not nervous/anxious.       Objective:   Physical Exam VS: see vs page GEN: no distress HEAD: head: no deformity eyes: no periorbital swelling, no proptosis external nose and ears are normal mouth: no lesion seen Both eac's and tm's are normal, except the right tm is slightly scarred. NECK: supple, thyroid is not enlarged.  i do not appreciate a thyroid nodule. CHEST WALL: no deformity LUNGS: clear to auscultation BREASTS:  No gynecomastia  CV: reg rate and rhythm, no murmur ABD: abdomen is soft, nontender.  no hepatosplenomegaly.  not distended.  no hernia RECTAL: normal external and internal exam.  heme neg. PROSTATE:  Normal size.  No nodule MUSCULOSKELETAL: muscle bulk and strength are grossly normal.  no obvious joint swelling.  gait is normal and steady EXTEMITIES: no deformity.  no ulcer on the feet.  feet are of normal color and temp.  no edema PULSES: dorsalis pedis intact bilat.  no carotid bruit NEURO:  cn 2-12 grossly intact.   readily moves all 4's.  sensation is intact to touch on the feet SKIN:  Normal texture and temperature.  No rash or suspicious lesion is visible.   NODES:  None palpable at the neck PSYCH: alert, oriented x3.  Does not appear anxious nor depressed.    Assessment & Plan:  Wellness  visit today, with problems stable, except as noted (alk phos is slightly high)

## 2010-10-17 NOTE — Telephone Encounter (Signed)
Is pt to take 1 tablet po qd or 1/2 tablet po qd-please advise

## 2010-10-17 NOTE — Telephone Encounter (Signed)
Pharm needs clarification on pt's norvasc. There are 2 sets of directions.

## 2010-10-18 LAB — ALKALINE PHOSPHATASE, ISOENZYMES
ALP, Heat Stable (Liver): 59 U/L
Alk Phos Bone Fract: 52 U/L
Alk Phos Liver Fract: 53 %
Alk Phos: 111 U/L (ref 39–117)

## 2010-10-28 ENCOUNTER — Telehealth: Payer: Self-pay | Admitting: *Deleted

## 2010-10-28 NOTE — Telephone Encounter (Signed)
Pt left message requesting results of labs from 8/27 OV. Left message for pt to callback office

## 2011-10-11 ENCOUNTER — Telehealth: Payer: Self-pay | Admitting: Endocrinology

## 2011-10-11 MED ORDER — AMLODIPINE BESYLATE 10 MG PO TABS: 5.0000 mg | ORAL_TABLET | Freq: Every day | ORAL | Status: DC

## 2011-10-11 NOTE — Telephone Encounter (Signed)
Ok with me, but will need Ok with Dr Everardo All as well ; amlodipine sent erx

## 2011-10-11 NOTE — Telephone Encounter (Signed)
The pt called and is hoping to switch his primary care provider from Dr. Everardo All to Dr. Jonny Ruiz.  (He was a pt of Dr.John's in 1997).  Is this okay?  Also, the pt is hoping to get a refill of his blood pressure medicine called into the [facility].  Thanks!

## 2011-10-11 NOTE — Telephone Encounter (Signed)
ok 

## 2011-11-03 ENCOUNTER — Encounter: Payer: Self-pay | Admitting: Internal Medicine

## 2011-11-03 DIAGNOSIS — J439 Emphysema, unspecified: Secondary | ICD-10-CM

## 2011-11-03 DIAGNOSIS — I351 Nonrheumatic aortic (valve) insufficiency: Secondary | ICD-10-CM | POA: Insufficient documentation

## 2011-11-03 DIAGNOSIS — E039 Hypothyroidism, unspecified: Secondary | ICD-10-CM | POA: Insufficient documentation

## 2011-11-03 DIAGNOSIS — N529 Male erectile dysfunction, unspecified: Secondary | ICD-10-CM

## 2011-11-03 HISTORY — DX: Hypothyroidism, unspecified: E03.9

## 2011-11-03 HISTORY — DX: Nonrheumatic aortic (valve) insufficiency: I35.1

## 2011-11-03 HISTORY — DX: Male erectile dysfunction, unspecified: N52.9

## 2011-11-03 HISTORY — DX: Emphysema, unspecified: J43.9

## 2011-11-10 ENCOUNTER — Encounter: Payer: Self-pay | Admitting: Internal Medicine

## 2011-11-10 ENCOUNTER — Ambulatory Visit (INDEPENDENT_AMBULATORY_CARE_PROVIDER_SITE_OTHER): Payer: BC Managed Care – PPO | Admitting: Internal Medicine

## 2011-11-10 ENCOUNTER — Other Ambulatory Visit: Payer: Self-pay | Admitting: Internal Medicine

## 2011-11-10 ENCOUNTER — Other Ambulatory Visit (INDEPENDENT_AMBULATORY_CARE_PROVIDER_SITE_OTHER): Payer: BC Managed Care – PPO

## 2011-11-10 VITALS — BP 110/70 | HR 72 | Temp 97.0°F | Ht 71.0 in | Wt 165.0 lb

## 2011-11-10 DIAGNOSIS — Z0000 Encounter for general adult medical examination without abnormal findings: Secondary | ICD-10-CM

## 2011-11-10 DIAGNOSIS — Z79899 Other long term (current) drug therapy: Secondary | ICD-10-CM

## 2011-11-10 LAB — HEPATIC FUNCTION PANEL
ALT: 17 U/L (ref 0–53)
Alkaline Phosphatase: 104 U/L (ref 39–117)
Bilirubin, Direct: 0.1 mg/dL (ref 0.0–0.3)
Total Protein: 7.7 g/dL (ref 6.0–8.3)

## 2011-11-10 LAB — TSH: TSH: 4.32 u[IU]/mL (ref 0.35–5.50)

## 2011-11-10 LAB — URINALYSIS, ROUTINE W REFLEX MICROSCOPIC
Hgb urine dipstick: NEGATIVE
Urine Glucose: NEGATIVE
Urobilinogen, UA: 0.2 (ref 0.0–1.0)

## 2011-11-10 LAB — BASIC METABOLIC PANEL
CO2: 28 mEq/L (ref 19–32)
Chloride: 102 mEq/L (ref 96–112)
Glucose, Bld: 97 mg/dL (ref 70–99)
Sodium: 137 mEq/L (ref 135–145)

## 2011-11-10 LAB — LIPID PANEL
Cholesterol: 251 mg/dL — ABNORMAL HIGH (ref 0–200)
Total CHOL/HDL Ratio: 7

## 2011-11-10 LAB — CBC WITH DIFFERENTIAL/PLATELET
Basophils Absolute: 0.1 10*3/uL (ref 0.0–0.1)
Eosinophils Absolute: 0.3 10*3/uL (ref 0.0–0.7)
Lymphocytes Relative: 33.8 % (ref 12.0–46.0)
MCHC: 33 g/dL (ref 30.0–36.0)
Neutrophils Relative %: 57.3 % (ref 43.0–77.0)
Platelets: 253 10*3/uL (ref 150.0–400.0)
RDW: 14.2 % (ref 11.5–14.6)

## 2011-11-10 LAB — LDL CHOLESTEROL, DIRECT: Direct LDL: 186.5 mg/dL

## 2011-11-10 LAB — PSA: PSA: 0.63 ng/mL (ref 0.10–4.00)

## 2011-11-10 MED ORDER — ATORVASTATIN CALCIUM 20 MG PO TABS: 20.0000 mg | ORAL_TABLET | Freq: Every day | ORAL | Status: DC

## 2011-11-10 MED ORDER — ASPIRIN 81 MG PO TBEC: 81.0000 mg | DELAYED_RELEASE_TABLET | Freq: Every day | ORAL | Status: AC

## 2011-11-10 MED ORDER — AMLODIPINE BESYLATE 10 MG PO TABS: 5.0000 mg | ORAL_TABLET | Freq: Every day | ORAL | Status: DC

## 2011-11-10 MED ORDER — SILDENAFIL CITRATE 100 MG PO TABS: 100.0000 mg | ORAL_TABLET | Freq: Every day | ORAL | Status: DC | PRN

## 2011-11-10 NOTE — Progress Notes (Signed)
Subjective:    Patient ID: Frederick Scott, male    DOB: 05-26-1955, 56 y.o.   MRN: [id number]  HPI  Here for wellness and f/u;  Overall doing ok;  Pt denies CP, worsening SOB, DOE, wheezing, orthopnea, PND, worsening LE edema, palpitations, dizziness or syncope.  Pt denies neurological change such as new Headache, facial or extremity weakness.  Pt denies polydipsia, polyuria, or low sugar symptoms. Pt states overall good compliance with treatment and medications, good tolerability, and trying to follow lower cholesterol diet.  Pt denies worsening depressive symptoms, suicidal ideation or panic. No fever, wt loss, night sweats, loss of appetite, or other constitutional symptoms.  Pt states good ability with ADL's, low fall risk, home safety reviewed and adequate, no significant changes in hearing or vision, and occasionally active with exercise.  Very upset today he paid $200 for a bill related to the alk phos isoenzymes last yr, when his insurance only covers wellness, and o/w has $6000 deductible  Brother with liver issue on statin, delcines statin today.  Not taking the thyroid med for 6 mo, does not want to take for now. Needs Viagra refill. Past Medical History  Diagnosis Date  . THYROID NODULE, LEFT 11/07/2007  . HYPERCHOLESTEROLEMIA 01/16/2007  . HYPERTENSION 09/14/2006  . Cough 11/07/2007  . CHEST PAIN, PLEURITIC 01/16/2007  . SMOKER 10/06/2008  . Hyperglycemia   . History of scarlet fever   . Raynaud's phenomenon   . Eustachian tube dysfunction     Bilateral ear tubs for Eustachian Valve Dysfunction  . Aortic valve regurgitation 11/03/2011    Mild, with normal EF Nov 2007 echo  . Emphysema 11/03/2011    Diffuse changes by CT chest 2005  . Hypothyroidism 11/03/2011  . Erectile dysfunction 11/03/2011   Past Surgical History  Procedure Date  . Nasal septum surgery 1980  . Thyroid lobectomy 03/2005    left    reports that he has been smoking Cigars.  He does not have any smokeless tobacco  history on file. He reports that he drinks alcohol. His drug history not on file. family history is negative for Cancer. Allergies  Allergen Reactions  . Ace Inhibitors    Current Outpatient Prescriptions on File Prior to Visit  Medication Sig Dispense Refill  . amLODipine (NORVASC) 10 MG tablet Take 0.5 tablets (5 mg total) by mouth daily. 1/2 tablet once daily  90 tablet  1  . fish oil-omega-3 fatty acids 1000 MG capsule Take 1 capsule by mouth daily.        . Multiple Vitamin (MULTIVITAMIN) tablet Take 1 tablet by mouth daily.        . sildenafil (VIAGRA) 100 MG tablet Take 1 tablet (100 mg total) by mouth daily as needed.  12 tablet  11  . atorvastatin (LIPITOR) 20 MG tablet Take 1 tablet (20 mg total) by mouth daily.  90 tablet  3   Review of Systems Review of Systems  Constitutional: Negative for diaphoresis, activity change, appetite change and unexpected weight change.  HENT: Negative for hearing loss, ear pain, facial swelling, mouth sores and neck stiffness.   Eyes: Negative for pain, redness and visual disturbance.  Respiratory: Negative for shortness of breath and wheezing.   Cardiovascular: Negative for chest pain and palpitations.  Gastrointestinal: Negative for diarrhea, blood in stool, abdominal distention and rectal pain.  Genitourinary: Negative for hematuria, flank pain and decreased urine volume.  Musculoskeletal: Negative for myalgias and joint swelling.  Skin: Negative for color change and wound.  Neurological: Negative for syncope and numbness.  Hematological: Negative for adenopathy.  Psychiatric/Behavioral: Negative for hallucinations, self-injury, decreased concentration and agitation.      Objective:   Physical Exam BP 110/70  Pulse 72  Temp 97 F (36.1 C) (Oral)  Ht 5\' 11"  (1.803 m)  Wt 165 lb (74.844 kg)  BMI 23.01 kg/m2  SpO2 95% Physical Exam  VS noted Constitutional: Pt is oriented to person, place, and time. Appears well-developed and  well-nourished.  HENT:  Head: Normocephalic and atraumatic.  Right Ear: External ear normal.  Left Ear: External ear normal.  Nose: Nose normal.  Mouth/Throat: Oropharynx is clear and moist.  Eyes: Conjunctivae and EOM are normal. Pupils are equal, round, and reactive to light.  Neck: Normal range of motion. Neck supple. No JVD present. No tracheal deviation present.  Cardiovascular: Normal rate, regular rhythm, normal heart sounds and intact distal pulses.   Pulmonary/Chest: Effort normal and breath sounds normal.  Abdominal: Soft. Bowel sounds are normal. There is no tenderness.  Musculoskeletal: Normal range of motion. Exhibits no edema.  Lymphadenopathy:  Has no cervical adenopathy.  Neurological: Pt is alert and oriented to person, place, and time. Pt has normal reflexes. No cranial nerve deficit.  Skin: Skin is warm and dry. No rash noted.  Psychiatric:  Has  normal mood and affect. Behavior is normal. 1+ nervous    Assessment & Plan:

## 2011-11-10 NOTE — Patient Instructions (Addendum)
Please consider getting the flu shot later this season Continue all other medications as before You are given the hardcopies of refills today Please start Aspirin 81 mg - 1 per day - Enteric Coated only Please call if you change your mind about a colonoscopy Please return in 1 year for your yearly visit, or sooner if needed, with Lab testing done 3-5 days before Please remember to sign up for My Chart at your earliest convenience, as this will be important to you in the future with finding out test results.

## 2011-11-11 ENCOUNTER — Encounter: Payer: Self-pay | Admitting: Internal Medicine

## 2011-11-11 NOTE — Assessment & Plan Note (Signed)

## 2012-10-14 ENCOUNTER — Other Ambulatory Visit: Payer: Self-pay | Admitting: Internal Medicine

## 2012-11-15 ENCOUNTER — Ambulatory Visit (INDEPENDENT_AMBULATORY_CARE_PROVIDER_SITE_OTHER): Payer: 59 | Admitting: Internal Medicine

## 2012-11-15 ENCOUNTER — Encounter: Payer: Self-pay | Admitting: Internal Medicine

## 2012-11-15 ENCOUNTER — Other Ambulatory Visit (INDEPENDENT_AMBULATORY_CARE_PROVIDER_SITE_OTHER): Payer: 59

## 2012-11-15 DIAGNOSIS — Z0000 Encounter for general adult medical examination without abnormal findings: Secondary | ICD-10-CM

## 2012-11-15 DIAGNOSIS — E7800 Pure hypercholesterolemia, unspecified: Secondary | ICD-10-CM

## 2012-11-15 LAB — BASIC METABOLIC PANEL
BUN: 15 mg/dL (ref 6–23)
CO2: 26 mEq/L (ref 19–32)
Calcium: 9.5 mg/dL (ref 8.4–10.5)
Chloride: 103 mEq/L (ref 96–112)
Potassium: 4.5 mEq/L (ref 3.5–5.1)
Sodium: 137 mEq/L (ref 135–145)

## 2012-11-15 LAB — CBC WITH DIFFERENTIAL/PLATELET
Basophils Absolute: 0.1 10*3/uL (ref 0.0–0.1)
Eosinophils Absolute: 0.2 10*3/uL (ref 0.0–0.7)
HCT: 50.9 % (ref 39.0–52.0)
Hemoglobin: 17.4 g/dL — ABNORMAL HIGH (ref 13.0–17.0)
Lymphs Abs: 4.2 10*3/uL — ABNORMAL HIGH (ref 0.7–4.0)
MCHC: 34.3 g/dL (ref 30.0–36.0)
Neutro Abs: 9 10*3/uL — ABNORMAL HIGH (ref 1.4–7.7)
Neutrophils Relative %: 62 % (ref 43.0–77.0)
Platelets: 245 10*3/uL (ref 150.0–400.0)
RDW: 14 % (ref 11.5–14.6)
WBC: 14.6 10*3/uL — ABNORMAL HIGH (ref 4.5–10.5)

## 2012-11-15 LAB — LIPID PANEL
Total CHOL/HDL Ratio: 5
Triglycerides: 88 mg/dL (ref 0.0–149.0)

## 2012-11-15 LAB — HEPATIC FUNCTION PANEL
ALT: 17 U/L (ref 0–53)
AST: 25 U/L (ref 0–37)
Alkaline Phosphatase: 107 U/L (ref 39–117)
Bilirubin, Direct: 0.1 mg/dL (ref 0.0–0.3)
Total Protein: 7.9 g/dL (ref 6.0–8.3)

## 2012-11-15 LAB — URINALYSIS, ROUTINE W REFLEX MICROSCOPIC
Bilirubin Urine: NEGATIVE
Hgb urine dipstick: NEGATIVE
Ketones, ur: NEGATIVE
Leukocytes, UA: NEGATIVE
Urobilinogen, UA: 0.2 (ref 0.0–1.0)

## 2012-11-15 LAB — TSH: TSH: 4.93 u[IU]/mL (ref 0.35–5.50)

## 2012-11-15 LAB — LDL CHOLESTEROL, DIRECT: Direct LDL: 183.1 mg/dL

## 2012-11-15 MED ORDER — SILDENAFIL CITRATE 100 MG PO TABS: 100.0000 mg | ORAL_TABLET | Freq: Every day | ORAL | Status: DC | PRN

## 2012-11-15 NOTE — Patient Instructions (Addendum)
Please return if you change your mind about the flu shot Please follow lower cholesterol diet Please stop smoking Please continue all other medications as before, and refills have been done if requested. Please have the pharmacy call with any other refills you may need. Please continue your efforts at being more active, low cholesterol diet, and weight control. You are otherwise up to date with prevention measures today. Please go to the LAB in the Basement (turn left off the elevator) for the tests to be done today You will be contacted by phone if any changes need to be made immediately.  Otherwise, you will receive a letter about your results with an explanation, but please check with MyChart first.  Please remember to sign up for My Chart if you have not done so, as this will be important to you in the future with finding out test results, communicating by private email, and scheduling acute appointments online when needed.  Please return in 1 year for your yearly visit, or sooner if needed

## 2012-11-15 NOTE — Assessment & Plan Note (Signed)

## 2012-11-15 NOTE — Progress Notes (Signed)
Subjective:    Patient ID: Frederick Scott, male    DOB: 07/23/1955, 57 y.o.   MRN: [id number]  HPI  Here for wellness and f/u;  Overall doing ok;  Pt denies CP, worsening SOB, DOE, wheezing, orthopnea, PND, worsening LE edema, palpitations, dizziness or syncope.  Pt denies neurological change such as new headache, facial or extremity weakness.  Pt denies polydipsia, polyuria, or low sugar symptoms. Pt states overall good compliance with treatment and medications, good tolerability, and has been trying to follow lower cholesterol diet.  Pt denies worsening depressive symptoms, suicidal ideation or panic. No fever, night sweats, wt loss, loss of appetite, or other constitutional symptoms.  Pt states good ability with ADL's, has low fall risk, home safety reviewed and adequate, no other significant changes in hearing or vision, and only occasionally active with exercise.  Admits to some diet noncompliance , does not want statin- adamant he will not take after father had issues, and brother had "liver damage".  Declines immuniz and colonoscopy. Past Medical History  Diagnosis Date  . THYROID NODULE, LEFT 11/07/2007  . HYPERCHOLESTEROLEMIA 01/16/2007  . HYPERTENSION 09/14/2006  . Cough 11/07/2007  . CHEST PAIN, PLEURITIC 01/16/2007  . SMOKER 10/06/2008  . Hyperglycemia   . History of scarlet fever   . Raynaud's phenomenon   . Eustachian tube dysfunction     Bilateral ear tubs for Eustachian Valve Dysfunction  . Aortic valve regurgitation 11/03/2011    Mild, with normal EF Nov 2007 echo  . Emphysema 11/03/2011    Diffuse changes by CT chest 2005  . Hypothyroidism 11/03/2011  . Erectile dysfunction 11/03/2011   Past Surgical History  Procedure Laterality Date  . Nasal septum surgery  1980  . Thyroid lobectomy  03/2005    left    reports that he has been smoking Cigars.  He does not have any smokeless tobacco history on file. He reports that  drinks alcohol. His drug history is not on file. family  history is negative for Cancer. Allergies  Allergen Reactions  . Ace Inhibitors   . Statins Other (See Comments)    Refusesdue to Family with problems with statins   Current Outpatient Prescriptions on File Prior to Visit  Medication Sig Dispense Refill  . amLODipine (NORVASC) 10 MG tablet TAKE 1/2 TABLET (5 MG TOTAL) BY MOUTH DAILY.  90 tablet  3  . aspirin 81 MG EC tablet Take 1 tablet (81 mg total) by mouth daily. Swallow whole.  30 tablet  12  . fish oil-omega-3 fatty acids 1000 MG capsule Take 1 capsule by mouth daily.        . Multiple Vitamin (MULTIVITAMIN) tablet Take 1 tablet by mouth daily.         No current facility-administered medications on file prior to visit.     Review of Systems Constitutional: Negative for diaphoresis, activity change, appetite change or unexpected weight change.  HENT: Negative for hearing loss, ear pain, facial swelling, mouth sores and neck stiffness.   Eyes: Negative for pain, redness and visual disturbance.  Respiratory: Negative for shortness of breath and wheezing.   Cardiovascular: Negative for chest pain and palpitations.  Gastrointestinal: Negative for diarrhea, blood in stool, abdominal distention or other pain Genitourinary: Negative for hematuria, flank pain or change in urine volume.  Musculoskeletal: Negative for myalgias and joint swelling.  Skin: Negative for color change and wound.  Neurological: Negative for syncope and numbness. other than noted Hematological: Negative for adenopathy.  Psychiatric/Behavioral: Negative for hallucinations,  self-injury, decreased concentration and agitation.      Objective:   Physical Exam There were no vitals taken for this visit. VS noted,  Constitutional: Pt is oriented to person, place, and time. Appears well-developed and well-nourished.  Head: Normocephalic and atraumatic.  Right Ear: External ear normal.  Left Ear: External ear normal.  Nose: Nose normal.  Mouth/Throat: Oropharynx  is clear and moist.  Eyes: Conjunctivae and EOM are normal. Pupils are equal, round, and reactive to light.  Neck: Normal range of motion. Neck supple. No JVD present. No tracheal deviation present.  Cardiovascular: Normal rate, regular rhythm, normal heart sounds and intact distal pulses.   Pulmonary/Chest: Effort normal and breath sounds normal.  Abdominal: Soft. Bowel sounds are normal. There is no tenderness. No HSM  Musculoskeletal: Normal range of motion. Exhibits no edema.  Lymphadenopathy:  Has no cervical adenopathy.  Neurological: Pt is alert and oriented to person, place, and time. Pt has normal reflexes. No cranial nerve deficit.  Skin: Skin is warm and dry. No rash noted.  Psychiatric:  Has  normal mood and affect. Behavior is normal.     Assessment & Plan:

## 2012-11-17 ENCOUNTER — Encounter: Payer: Self-pay | Admitting: Internal Medicine

## 2012-11-17 NOTE — Assessment & Plan Note (Signed)
D/w pt, refuses statin, to f/u any worsening symptoms or concerns

## 2013-02-14 ENCOUNTER — Ambulatory Visit (INDEPENDENT_AMBULATORY_CARE_PROVIDER_SITE_OTHER): Payer: 59 | Admitting: Internal Medicine

## 2013-02-14 ENCOUNTER — Encounter: Payer: Self-pay | Admitting: Internal Medicine

## 2013-02-14 VITALS — BP 160/98 | HR 75 | Temp 97.0°F | Ht 71.0 in | Wt 168.2 lb

## 2013-02-14 DIAGNOSIS — I10 Essential (primary) hypertension: Secondary | ICD-10-CM

## 2013-02-14 DIAGNOSIS — E7800 Pure hypercholesterolemia, unspecified: Secondary | ICD-10-CM

## 2013-02-14 DIAGNOSIS — E039 Hypothyroidism, unspecified: Secondary | ICD-10-CM

## 2013-02-14 MED ORDER — ATENOLOL 25 MG PO TABS: 25.0000 mg | ORAL_TABLET | Freq: Every day | ORAL | Status: DC

## 2013-02-14 MED ORDER — AMLODIPINE BESYLATE 10 MG PO TABS: ORAL_TABLET | ORAL | Status: DC

## 2013-02-14 NOTE — Progress Notes (Signed)
Subjective:    Patient ID: Frederick Scott, male    DOB: 09/22/1955, 57 y.o.   MRN: [id number]  HPI  Here to f/u, c/o gradually increased BP and several BP about 140-160 sbp, without wt gain or diet change.  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  Pt denies chest pain, increased sob or doe, wheezing, orthopnea, PND, increased LE swelling, palpitations, dizziness or syncope.   Pt denies polydipsia, polyuria. Pt denies new neurological symptoms such as new headache, or facial or extremity weakness or numbness. Denies hyper or hypo thyroid symptoms such as voice, skin or hair change. Past Medical History  Diagnosis Date  . THYROID NODULE, LEFT 11/07/2007  . HYPERCHOLESTEROLEMIA 01/16/2007  . HYPERTENSION 09/14/2006  . Cough 11/07/2007  . CHEST PAIN, PLEURITIC 01/16/2007  . SMOKER 10/06/2008  . Hyperglycemia   . History of scarlet fever   . Raynaud's phenomenon   . Eustachian tube dysfunction     Bilateral ear tubs for Eustachian Valve Dysfunction  . Aortic valve regurgitation 11/03/2011    Mild, with normal EF Nov 2007 echo  . Emphysema 11/03/2011    Diffuse changes by CT chest 2005  . Hypothyroidism 11/03/2011  . Erectile dysfunction 11/03/2011   Past Surgical History  Procedure Laterality Date  . Nasal septum surgery  1980  . Thyroid lobectomy  03/2005    left    reports that he has been smoking Cigars.  He does not have any smokeless tobacco history on file. He reports that he drinks alcohol. His drug history is not on file. family history is negative for Cancer. Allergies  Allergen Reactions  . Ace Inhibitors   . Statins Other (See Comments)    Refusesdue to Family with problems with statins   Current Outpatient Prescriptions on File Prior to Visit  Medication Sig Dispense Refill  . aspirin 81 MG EC tablet Take 1 tablet (81 mg total) by mouth daily. Swallow whole.  30 tablet  12  . fish oil-omega-3 fatty acids 1000 MG capsule Take 1 capsule by  mouth daily.        . Multiple Vitamin (MULTIVITAMIN) tablet Take 1 tablet by mouth daily.        . sildenafil (VIAGRA) 100 MG tablet Take 1 tablet (100 mg total) by mouth daily as needed.  12 tablet  11   No current facility-administered medications on file prior to visit.   Review of Systems  Constitutional: Negative for unexpected weight change, or unusual diaphoresis  HENT: Negative for tinnitus.   Eyes: Negative for photophobia and visual disturbance.  Respiratory: Negative for choking and stridor.   Gastrointestinal: Negative for vomiting and blood in stool.  Genitourinary: Negative for hematuria and decreased urine volume.  Musculoskeletal: Negative for acute joint swelling Skin: Negative for color change and wound.  Neurological: Negative for tremors and numbness other than noted  Psychiatric/Behavioral: Negative for decreased concentration or  hyperactivity.       Objective:   Physical Exam BP 160/98  Pulse 75  Temp(Src) 97 F (36.1 C) (Oral)  Ht 5\' 11"  (1.803 m)  Wt 168 lb 4 oz (76.318 kg)  BMI 23.48 kg/m2  SpO2 94% VS noted,  Constitutional: Pt appears well-developed and well-nourished.  HENT: Head: NCAT.  Right Ear: External ear normal.  Left Ear: External ear normal.  Eyes: Conjunctivae and EOM are normal. Pupils are equal, round, and reactive to light.  Neck: Normal range of motion. Neck supple.  Cardiovascular:  Normal rate and regular rhythm.   Pulmonary/Chest: Effort normal and breath sounds normal.  Abd:  Soft, NT, non-distended, + BS Neurological: Pt is alert. Not confused  Skin: Skin is warm. No erythema.  Psychiatric: Pt behavior is normal. Thought content normal.      Assessment & Plan:

## 2013-02-14 NOTE — Progress Notes (Signed)
Pre-visit discussion using our clinic review tool. No additional management support is needed unless otherwise documented below in the visit note.  

## 2013-02-14 NOTE — Assessment & Plan Note (Signed)
To increse the amlod to 10 qd, also add atenolol 25 qd, f/u BP at home as he does, call in 1 wk with results, consider incr the atenolol to 50

## 2013-02-14 NOTE — Assessment & Plan Note (Signed)
stable overall by history and exam, recent data reviewed with pt, and pt to continue medical treatment as before,  to f/u any worsening symptoms or concerns Lab Results  Component Value Date   TSH 4.93 11/15/2012

## 2013-02-14 NOTE — Assessment & Plan Note (Signed)
Lab Results  Component Value Date   LDLCALC 94 03/27/2006   stable overall by history and exam, recent data reviewed with pt, and pt to continue medical treatment as before,  to f/u any worsening symptoms or concerns

## 2013-02-14 NOTE — Patient Instructions (Signed)
OK to increase the amlodipine to 10 mg per day Please take all new medication as prescribed  - the atenolol 25 mg per day Please continue all other medications as before, and refills have been done if requested. Please have the pharmacy call with any other refills you may need.  Please check your blood pressures on a regular basis, and call in 3 wks with the most recent values, to make sure as your goal is at least to be < 140/90

## 2013-03-17 ENCOUNTER — Telehealth: Payer: Self-pay | Admitting: Internal Medicine

## 2013-03-17 NOTE — Telephone Encounter (Signed)
Relevant patient education mailed to patient.  

## 2013-03-18 ENCOUNTER — Telehealth: Payer: Self-pay | Admitting: Internal Medicine

## 2013-03-18 NOTE — Telephone Encounter (Signed)
Norvasc does not cause ear ringing, so ok to go back to the prescribed dose.

## 2013-03-18 NOTE — Telephone Encounter (Signed)
Patient informed of MD instructions. 

## 2013-03-18 NOTE — Telephone Encounter (Signed)
Pt called stated that he was here 1.26.15 and Dr. [name] gave him new BP med (Altenolol) to take to get the BP in control. Pt stated that this medication is working too good because his BP is running around 150/80. Pt would like to know what Dr. [name] would like to do. Please call pt

## 2013-03-18 NOTE — Telephone Encounter (Signed)
Called the patient back to confirm his SBP has remained in the 150's and not improved on medication changes.Marland Kitchen  He did state the bottom number has improved though.  He did have to go back to 1/2 norvasc as taking it once daily was causing his ears to ring.

## 2013-03-18 NOTE — Telephone Encounter (Signed)
Please ask pt if this message was accurate, as SBP 150 is still elevated

## 2013-03-18 NOTE — Telephone Encounter (Signed)
Sorry, but I have no other further recommendation.  I still recommend the full dosing as norvasc is not related to ringing of the ears

## 2013-03-18 NOTE — Telephone Encounter (Signed)
The patent stated taking 1/2 he has not ringing and does want to stay there, but please advise on alterative

## 2015-02-18 ENCOUNTER — Other Ambulatory Visit: Payer: Self-pay | Admitting: Internal Medicine

## 2015-02-19 ENCOUNTER — Telehealth: Payer: Self-pay | Admitting: Internal Medicine

## 2015-02-19 MED ORDER — AMLODIPINE BESYLATE 10 MG PO TABS: ORAL_TABLET | ORAL | Status: DC

## 2015-02-19 NOTE — Telephone Encounter (Signed)
Pharmacy sent a request for amLODipine (NORVASC) 10 MG tablet [id number]. Pt called in and he now has an appointment for next Friday. He is out of his medication and is wondering if you can send enough in till he can come in. Please call him at [phone]

## 2015-02-26 ENCOUNTER — Ambulatory Visit (INDEPENDENT_AMBULATORY_CARE_PROVIDER_SITE_OTHER): Payer: 59 | Admitting: Internal Medicine

## 2015-02-26 ENCOUNTER — Encounter: Payer: Self-pay | Admitting: Internal Medicine

## 2015-02-26 VITALS — BP 138/82 | HR 80 | Temp 97.6°F | Ht 71.0 in | Wt 163.0 lb

## 2015-02-26 DIAGNOSIS — I10 Essential (primary) hypertension: Secondary | ICD-10-CM

## 2015-02-26 DIAGNOSIS — I7300 Raynaud's syndrome without gangrene: Secondary | ICD-10-CM | POA: Diagnosis not present

## 2015-02-26 DIAGNOSIS — Z0000 Encounter for general adult medical examination without abnormal findings: Secondary | ICD-10-CM

## 2015-02-26 DIAGNOSIS — M545 Low back pain, unspecified: Secondary | ICD-10-CM

## 2015-02-26 DIAGNOSIS — M7711 Lateral epicondylitis, right elbow: Secondary | ICD-10-CM

## 2015-02-26 MED ORDER — NIFEDIPINE ER OSMOTIC RELEASE 60 MG PO TB24: 60.0000 mg | ORAL_TABLET | Freq: Every day | ORAL | Status: DC

## 2015-02-26 MED ORDER — SILDENAFIL CITRATE 100 MG PO TABS: 100.0000 mg | ORAL_TABLET | Freq: Every day | ORAL | Status: DC | PRN

## 2015-02-26 NOTE — Patient Instructions (Signed)
Ok to stop the amlodipine  Please take all new medication as prescribed - the procardia XL 60 mg per day  Please continue all other medications as before, and refills have been done if requested.  Please have the pharmacy call with any other refills you may need.  Please continue your efforts at being more active, low cholesterol diet, and weight control.  You are otherwise up to date with prevention measures today.  Please keep your appointments with your specialists as you may have planned  You are given the WORK note today  Please go to the LAB in the Basement (turn left off the elevator) for the tests to be done today  You will be contacted by phone if any changes need to be made immediately.  Otherwise, you will receive a letter about your results with an explanation, but please check with MyChart first.  Please remember to sign up for MyChart if you have not done so, as this will be important to you in the future with finding out test results, communicating by private email, and scheduling acute appointments online when needed.  Please return in 1 year for your yearly visit, or sooner if needed, with Lab testing done 3-5 days before

## 2015-02-26 NOTE — Progress Notes (Signed)
Subjective:    Patient ID: Frederick Scott, male    DOB: 24-Apr-1955, 60 y.o.   MRN: [id number]  HPI  Here for wellness and f/u;  Overall doing ok;  Pt denies Chest pain, worsening SOB, DOE, wheezing, orthopnea, PND, worsening LE edema, palpitations, dizziness or syncope.  Pt denies neurological change such as new headache, facial or extremity weakness.  Pt denies polydipsia, polyuria, or low sugar symptoms. Pt states overall good compliance with treatment and medications, good tolerability, and has been trying to follow appropriate diet.  Pt denies worsening depressive symptoms, suicidal ideation or panic. No fever, night sweats, wt loss, loss of appetite, or other constitutional symptoms.  Pt states good ability with ADL's, has low fall risk, home safety reviewed and adequate, no other significant changes in hearing or vision, and only occasionally active with exercise.  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  Has right lateral epicondylar pain, tender worse with doing this as well.  Pt continues to have recurring LBP without change in severity, bowel or bladder change, fever, wt loss,  worsening LE pain/numbness/weakness, gait change or falls.   Has not tried procardia in past.  Also with recurring tinnitus, has to use TV to go to sleep. Past Medical History  Diagnosis Date  . THYROID NODULE, LEFT 11/07/2007  . HYPERCHOLESTEROLEMIA 01/16/2007  . HYPERTENSION 09/14/2006  . Cough 11/07/2007  . CHEST PAIN, PLEURITIC 01/16/2007  . SMOKER 10/06/2008  . Hyperglycemia   . History of scarlet fever   . Raynaud's phenomenon   . Eustachian tube dysfunction     Bilateral ear tubs for Eustachian Valve Dysfunction  . Aortic valve regurgitation 11/03/2011    Mild, with normal EF Nov 2007 echo  . Emphysema 11/03/2011    Diffuse changes by CT chest 2005  . Hypothyroidism 11/03/2011  . Erectile  dysfunction 11/03/2011   Past Surgical History  Procedure Laterality Date  . Nasal septum surgery  1980  . Thyroid lobectomy  03/2005    left    reports that he has been smoking Cigars.  He does not have any smokeless tobacco history on file. He reports that he drinks alcohol. His drug history is not on file. family history is negative for Cancer. Allergies  Allergen Reactions  . Ace Inhibitors   . Statins Other (See Comments)    Refusesdue to Family with problems with statins   Current Outpatient Prescriptions on File Prior to Visit  Medication Sig Dispense Refill  . amLODipine (NORVASC) 10 MG tablet TAKE 1TABLET (5 MG TOTAL) BY MOUTH DAILY. 90 tablet 3  . aspirin 81 MG EC tablet Take 1 tablet (81 mg total) by mouth daily. Swallow whole. 30 tablet 12  . fish oil-omega-3 fatty acids 1000 MG capsule Take 1 capsule by mouth daily.      . Multiple Vitamin (MULTIVITAMIN) tablet Take 1 tablet by mouth daily.      . sildenafil (VIAGRA) 100 MG tablet Take 1 tablet (100 mg total) by mouth daily as needed. 12 tablet 11  . atenolol (TENORMIN) 25 MG tablet Take 1 tablet (25 mg total) by mouth daily. (Patient not taking: Reported on 02/26/2015) 90 tablet 3   No current facility-administered medications on file prior to visit.   Review of Systems Constitutional: Negative for increased diaphoresis, other activity, appetite or siginficant weight change other than noted HENT:  Negative for worsening hearing loss, ear pain, facial swelling, mouth sores and neck stiffness.   Eyes: Negative for other worsening pain, redness or visual disturbance.  Respiratory: Negative for shortness of breath and wheezing  Cardiovascular: Negative for chest pain and palpitations.  Gastrointestinal: Negative for diarrhea, blood in stool, abdominal distention or other pain Genitourinary: Negative for hematuria, flank pain or change in urine volume.  Musculoskeletal: Negative for myalgias or other joint complaints.  Skin:  Negative for color change and wound or drainage.  Neurological: Negative for syncope and numbness. other than noted Hematological: Negative for adenopathy. or other swelling Psychiatric/Behavioral: Negative for hallucinations, SI, self-injury, decreased concentration or other worsening agitation.      Objective:   Physical Exam BP 138/82 mmHg  Pulse 80  Temp(Src) 97.6 F (36.4 C) (Oral)  Ht 5\' 11"  (1.803 m)  Wt 163 lb (73.936 kg)  BMI 22.74 kg/m2  SpO2 97% VS noted,  Constitutional: Pt is oriented to person, place, and time. Appears well-developed and well-nourished, in no significant distress Head: Normocephalic and atraumatic.  Right Ear: External ear normal.  Left Ear: External ear normal.  Nose: Nose normal.  Mouth/Throat: Oropharynx is clear and moist.  Eyes: Conjunctivae and EOM are normal. Pupils are equal, round, and reactive to light.  Neck: Normal range of motion. Neck supple. No JVD present. No tracheal deviation present or significant neck LA or mass Cardiovascular: Normal rate, regular rhythm, normal heart sounds and intact distal pulses.   Pulmonary/Chest: Effort normal and breath sounds without rales or wheezing  Abdominal: Soft. Bowel sounds are normal. NT. No HSM  Musculoskeletal: Normal range of motion. Exhibits no edema.,mild right lateral epicondylar tender , spine nontender, has mild right lateral lower lumbar tender but no swelling, red, rash or spasm Lymphadenopathy:  Has no cervical adenopathy.  Neurological: Pt is alert and oriented to person, place, and time. Pt has normal reflexes. No cranial nerve deficit. Motor grossly intact Skin: Skin is warm and dry. No rash noted.  Psychiatric:  Has normal mood and affect. Behavior is normal.     Assessment & Plan:

## 2015-02-27 DIAGNOSIS — M545 Low back pain, unspecified: Secondary | ICD-10-CM | POA: Insufficient documentation

## 2015-02-27 DIAGNOSIS — M7711 Lateral epicondylitis, right elbow: Secondary | ICD-10-CM | POA: Insufficient documentation

## 2015-02-27 NOTE — Assessment & Plan Note (Signed)

## 2015-02-27 NOTE — Assessment & Plan Note (Signed)
Worsening symptoms, gave work note to avoid low temp environment, wear gloves, change CCB to procardia XL,  to f/u any worsening symptoms or concerns

## 2015-02-27 NOTE — Assessment & Plan Note (Signed)
Ok for change CCB to procardia xl, watch for reflex tachycardia,  to f/u any worsening symptoms or concerns

## 2015-02-27 NOTE — Assessment & Plan Note (Signed)
Mild, for forearm band, allevel prn, consider f/u with Dr Smith/sports med

## 2015-02-27 NOTE — Assessment & Plan Note (Signed)
Mild, no neuro changes, for tylenol or alleve prn,  to f/u any worsening symptoms or concerns

## 2015-03-25 ENCOUNTER — Encounter: Payer: Self-pay | Admitting: *Deleted

## 2015-03-25 ENCOUNTER — Emergency Department
Admission: EM | Admit: 2015-03-25 | Discharge: 2015-03-25 | Disposition: A | Discharge status: Home / Self Care | Payer: 59 | Source: Home / Self Care | Attending: Family Medicine | Admitting: Family Medicine

## 2015-03-25 ENCOUNTER — Emergency Department (INDEPENDENT_AMBULATORY_CARE_PROVIDER_SITE_OTHER): Payer: 59

## 2015-03-25 DIAGNOSIS — R52 Pain, unspecified: Secondary | ICD-10-CM

## 2015-03-25 DIAGNOSIS — S90851A Superficial foreign body, right foot, initial encounter: Secondary | ICD-10-CM | POA: Diagnosis not present

## 2015-03-25 DIAGNOSIS — M25571 Pain in right ankle and joints of right foot: Secondary | ICD-10-CM | POA: Diagnosis not present

## 2015-03-25 DIAGNOSIS — Z23 Encounter for immunization: Secondary | ICD-10-CM | POA: Diagnosis not present

## 2015-03-25 MED ORDER — TETANUS-DIPHTH-ACELL PERTUSSIS 5-2.5-18.5 LF-MCG/0.5 IM SUSP
0.5000 mL | Freq: Once | INTRAMUSCULAR | Status: AC
  Administered 2015-03-25: 0.5 mL via INTRAMUSCULAR

## 2015-03-25 MED ORDER — CIPROFLOXACIN HCL 500 MG PO TABS: 500.0000 mg | ORAL_TABLET | Freq: Two times a day (BID) | ORAL | Status: DC

## 2015-03-25 NOTE — ED Notes (Signed)
Pt c/o right heel pain x 1 month after stepping on something in his basement while walking barefoot.

## 2015-03-25 NOTE — ED Provider Notes (Signed)
CSN: [id number]     Arrival date & time 03/25/15  [id number] History   First MD Initiated Contact with Patient 03/25/15 [id number]     Chief Complaint  Patient presents with  . Foot Pain    right heel   (Consider location/radiation/quality/duration/timing/severity/associated sxs/prior Treatment) HPI  Pt is a 60yo male presenting to [facility] with c/o Right heel pain that started about 1 month ago when he stepped on something in his basement while walking barefoot. Pt notes a family member is an EMT and tried to "dig" into his foot and was able to get a small spec of "something" out of his foot about 1 week after pain started. Pain was relieved for a few weeks but notes as a callous formed, pain came back.  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  Pain is sharp and stabbing whenever he puts pressure on his Right heel.  Denies fever, chills, n/v/d. Denies redness, drainage or bleeding at site of pain. He is unsure what may still be in his foot. Last Td 12/27/2005.   Past Medical History  Diagnosis Date  . THYROID NODULE, LEFT 11/07/2007  . HYPERCHOLESTEROLEMIA 01/16/2007  . HYPERTENSION 09/14/2006  . Cough 11/07/2007  . CHEST PAIN, PLEURITIC 01/16/2007  . SMOKER 10/06/2008  . Hyperglycemia   . History of scarlet fever   . Raynaud's phenomenon   . Eustachian tube dysfunction     Bilateral ear tubs for Eustachian Valve Dysfunction  . Aortic valve regurgitation 11/03/2011    Mild, with normal EF Nov 2007 echo  . Emphysema 11/03/2011    Diffuse changes by CT chest 2005  . Hypothyroidism 11/03/2011  . Erectile dysfunction 11/03/2011   Past Surgical History  Procedure Laterality Date  . Nasal septum surgery  1980  . Thyroid lobectomy  03/2005    left   Family History  Problem Relation Age of Onset  . Cancer Neg Hx   . Hypertension Father    Social History  Substance Use Topics  . Smoking status: Former Smoker    Types: Cigars  . Smokeless tobacco: Never Used  . Alcohol Use: Yes   Comment: 1-4/weekend    Review of Systems  Constitutional: Negative for fever and chills.  Musculoskeletal: Positive for arthralgias (bottom of Right heel) and gait problem. Negative for joint swelling.  Skin: Negative for color change and wound.  Neurological: Negative for weakness and numbness.    Allergies  Ace inhibitors and Statins  Home Medications   Prior to Admission medications   Medication Sig Start Date End Date Taking? Authorizing Provider  amLODipine (NORVASC) 5 MG tablet Take 5 mg by mouth daily.   Yes Historical Provider, MD  aspirin 81 MG EC tablet Take 1 tablet (81 mg total) by mouth daily. Swallow whole. 11/10/11   Biagio Borg, MD  ciprofloxacin (CIPRO) 500 MG tablet Take 1 tablet (500 mg total) by mouth 2 (two) times daily. One po bid x 7 days 03/25/15   Noland Fordyce, PA-C  fish oil-omega-3 fatty acids 1000 MG capsule Take 1 capsule by mouth daily.      Historical Provider, MD  Multiple Vitamin (MULTIVITAMIN) tablet Take 1 tablet by mouth daily.      Historical Provider, MD  NIFEdipine (PROCARDIA XL/ADALAT-CC) 60 MG 24 hr tablet Take 1 tablet (60 mg total) by mouth daily. 02/26/15   Biagio Borg, MD  sildenafil (VIAGRA) 100 MG tablet Take 1 tablet (100 mg total) by mouth daily  as needed. 02/26/15   Biagio Borg, MD   Meds Ordered and Administered this Visit   Medications  Tdap (BOOSTRIX) injection 0.5 mL (0.5 mLs Intramuscular Given 03/25/15 1011)    BP 175/95 mmHg  Pulse 89  Temp(Src) 97.5 F (36.4 C) (Oral)  Resp 16  Wt 161 lb (73.029 kg)  SpO2 99% No data found.   Physical Exam  Constitutional: He is oriented to person, place, and time. He appears well-developed and well-nourished.  HENT:  Head: Normocephalic and atraumatic.  Eyes: EOM are normal.  Neck: Normal range of motion.  Cardiovascular: Normal rate.   Pulmonary/Chest: Effort normal.  Musculoskeletal: Normal range of motion. He exhibits tenderness. He exhibits no edema.  Right foot: no obvious  edema or deformity. Full ROM ankle and toes.  Tenderness to plantar aspect of Right heel.   Neurological: He is alert and oriented to person, place, and time.  Skin: Skin is warm and dry. No rash noted. No erythema.  Right heel, plantar aspect: no erythema, ecchymosis, bleeding or discharge. 2cm area of calloused skin, tender to touch. Skin hardened due to callous. No foreign body initially visualized.    Psychiatric: He has a normal mood and affect. His behavior is normal.  Nursing note and vitals reviewed.   ED Course  .Foreign Body Removal Date/Time: 03/25/2015 10:43 AM Performed by: Noland Fordyce Authorized by: Theone Murdoch A Consent: Verbal consent obtained. Risks and benefits: risks, benefits and alternatives were discussed Consent given by: patient Patient understanding: patient states understanding of the procedure being performed Patient consent: the patient's understanding of the procedure matches consent given Site marked: the operative site was marked Imaging studies: imaging studies available Required items: required blood products, implants, devices, and special equipment available Patient identity confirmed: verbally with patient Body area: skin General location: lower extremity Location details: right foot Anesthesia: local infiltration Local anesthetic: lidocaine 2% with epinephrine Anesthetic total: 1 ml Patient sedated: no Patient restrained: no Localization method: probed and visualized (Location anesthetized, callous removed, spec of blackened FB visualized) Removal mechanism: forceps and scalpel Dressing: antibiotic ointment and dressing applied Tendon involvement: none Depth: subcutaneous Complexity: simple 2 objects recovered. Objects recovered: small black specs, wood splinter? Post-procedure assessment: foreign body removed (possible residual foreign body remains but none seen on imaging or after removal of visualized FBs) Patient tolerance: Patient  tolerated the procedure well with no immediate complications   (including critical care time)  Labs Review Labs Reviewed - No data to display  Imaging Review Dg Foot Complete Right  03/25/2015  CLINICAL DATA:  Possible foreign body laterally in the heel. Symptoms for several months. EXAM: RIGHT FOOT COMPLETE - 3+ VIEW COMPARISON:  None. FINDINGS: The bones appear mildly demineralized. There is no evidence acute fracture, dislocation or bone destruction. No radiopaque foreign body or soft tissue emphysema demonstrated. The joint spaces are maintained. IMPRESSION: No acute osseous findings or foreign bodies observed. Electronically Signed   By: Richardean Sale M.D.   On: 03/25/2015 09:36     MDM   1. Foreign body in right foot, initial encounter   2. Pain    Pt c/o pain to plantar aspect of Right heel after stepping on something while barefoot about 1 month ago.  Black specs noted on exam after removal of calloused skin.  No evidence of abscess or cellulitis, however due to procedure of removing calloused skin and hx of foreign body, will place pt on Ciprofloxacin to cover for pseudomonas.  Tdap updated today.  Home care instructions provided including warm soaks in salt water 15-30min at a time 2-3 times a day, may use OTC topical antibiotics for 5 days.  F/u with PCP or UC in 4-5 days if not improving, sooner if worsening. Patient verbalized understanding and agreement with treatment plan.      Noland Fordyce, PA-C 03/25/15 1048

## 2015-03-25 NOTE — Discharge Instructions (Signed)
Please take antibiotics as prescribed and be sure to complete entire course even if you start to feel better to ensure infection does not come back.  You should try to soak your foot in warm salt water (epson salt) for 15-20 minutes at a time, 2-3 times a day.  Keep wound clean and covered with a bandage. You may apply over the counter antibiotic ointment such as polysporin or neosporin for 5 days.

## 2015-03-29 ENCOUNTER — Telehealth: Payer: Self-pay | Admitting: Emergency Medicine

## 2015-03-29 NOTE — ED Notes (Signed)
Inquired about patient's status; encourage them to call with questions/concerns.  

## 2016-05-26 ENCOUNTER — Other Ambulatory Visit: Payer: Self-pay | Admitting: Internal Medicine

## 2016-07-28 ENCOUNTER — Encounter: Payer: 59 | Admitting: Internal Medicine

## 2016-08-01 ENCOUNTER — Encounter: Payer: Self-pay | Admitting: Internal Medicine

## 2016-08-01 ENCOUNTER — Telehealth: Payer: Self-pay | Admitting: Internal Medicine

## 2016-08-01 ENCOUNTER — Ambulatory Visit (INDEPENDENT_AMBULATORY_CARE_PROVIDER_SITE_OTHER): Payer: 59 | Admitting: Internal Medicine

## 2016-08-01 ENCOUNTER — Other Ambulatory Visit (INDEPENDENT_AMBULATORY_CARE_PROVIDER_SITE_OTHER): Payer: 59

## 2016-08-01 ENCOUNTER — Other Ambulatory Visit: Payer: Self-pay | Admitting: Internal Medicine

## 2016-08-01 VITALS — BP 134/86 | HR 71 | Ht 72.0 in | Wt 164.0 lb

## 2016-08-01 DIAGNOSIS — Z114 Encounter for screening for human immunodeficiency virus [HIV]: Secondary | ICD-10-CM

## 2016-08-01 DIAGNOSIS — R109 Unspecified abdominal pain: Secondary | ICD-10-CM | POA: Insufficient documentation

## 2016-08-01 DIAGNOSIS — F17200 Nicotine dependence, unspecified, uncomplicated: Secondary | ICD-10-CM | POA: Insufficient documentation

## 2016-08-01 DIAGNOSIS — Z1159 Encounter for screening for other viral diseases: Secondary | ICD-10-CM

## 2016-08-01 DIAGNOSIS — L989 Disorder of the skin and subcutaneous tissue, unspecified: Secondary | ICD-10-CM

## 2016-08-01 DIAGNOSIS — Z0001 Encounter for general adult medical examination with abnormal findings: Secondary | ICD-10-CM | POA: Diagnosis not present

## 2016-08-01 DIAGNOSIS — Z0000 Encounter for general adult medical examination without abnormal findings: Secondary | ICD-10-CM

## 2016-08-01 DIAGNOSIS — E049 Nontoxic goiter, unspecified: Secondary | ICD-10-CM | POA: Diagnosis not present

## 2016-08-01 LAB — HEPATIC FUNCTION PANEL
ALBUMIN: 4.6 g/dL (ref 3.5–5.2)
ALT: 15 U/L (ref 0–53)
AST: 21 U/L (ref 0–37)
Alkaline Phosphatase: 101 U/L (ref 39–117)
Bilirubin, Direct: 0.1 mg/dL (ref 0.0–0.3)
TOTAL PROTEIN: 7.5 g/dL (ref 6.0–8.3)
Total Bilirubin: 0.6 mg/dL (ref 0.2–1.2)

## 2016-08-01 LAB — PSA: PSA: 0.54 ng/mL (ref 0.10–4.00)

## 2016-08-01 LAB — CBC WITH DIFFERENTIAL/PLATELET
BASOS ABS: 0 10*3/uL (ref 0.0–0.1)
BASOS PCT: 0.3 % (ref 0.0–3.0)
EOS ABS: 0.2 10*3/uL (ref 0.0–0.7)
Eosinophils Relative: 1.7 % (ref 0.0–5.0)
HCT: 52 % (ref 39.0–52.0)
HEMOGLOBIN: 17.5 g/dL — AB (ref 13.0–17.0)
Lymphocytes Relative: 25.6 % (ref 12.0–46.0)
Lymphs Abs: 2.9 10*3/uL (ref 0.7–4.0)
MCHC: 33.7 g/dL (ref 30.0–36.0)
MCV: 97.2 fl (ref 78.0–100.0)
MONO ABS: 0.9 10*3/uL (ref 0.1–1.0)
Monocytes Relative: 7.8 % (ref 3.0–12.0)
Neutro Abs: 7.4 10*3/uL (ref 1.4–7.7)
Neutrophils Relative %: 64.6 % (ref 43.0–77.0)
Platelets: 238 10*3/uL (ref 150.0–400.0)
RBC: 5.35 Mil/uL (ref 4.22–5.81)
RDW: 14.1 % (ref 11.5–15.5)
WBC: 11.5 10*3/uL — AB (ref 4.0–10.5)

## 2016-08-01 LAB — LIPID PANEL
CHOLESTEROL: 216 mg/dL — AB (ref 0–200)
HDL: 40.4 mg/dL (ref >39.00)
LDL Cholesterol: 147 mg/dL — ABNORMAL HIGH (ref 0–99)
NonHDL: 175.88
TRIGLYCERIDES: 142 mg/dL (ref 0.0–149.0)
Total CHOL/HDL Ratio: 5
VLDL: 28.4 mg/dL (ref 0.0–40.0)

## 2016-08-01 LAB — URINALYSIS, ROUTINE W REFLEX MICROSCOPIC
Bilirubin Urine: NEGATIVE
Hgb urine dipstick: NEGATIVE
KETONES UR: NEGATIVE
Leukocytes, UA: NEGATIVE
NITRITE: NEGATIVE
RBC / HPF: NONE SEEN (ref 0–?)
Total Protein, Urine: NEGATIVE
URINE GLUCOSE: NEGATIVE
UROBILINOGEN UA: 0.2 (ref 0.0–1.0)
pH: 6.5 (ref 5.0–8.0)

## 2016-08-01 LAB — BASIC METABOLIC PANEL
BUN: 15 mg/dL (ref 6–23)
CHLORIDE: 102 meq/L (ref 96–112)
CO2: 30 meq/L (ref 19–32)
CREATININE: 0.97 mg/dL (ref 0.40–1.50)
Calcium: 10 mg/dL (ref 8.4–10.5)
GFR: 83.65 mL/min (ref >60.00)
GLUCOSE: 94 mg/dL (ref 70–99)
POTASSIUM: 4.9 meq/L (ref 3.5–5.1)
Sodium: 137 mEq/L (ref 135–145)

## 2016-08-01 LAB — TSH: TSH: 5.29 u[IU]/mL — AB (ref 0.35–4.50)

## 2016-08-01 MED ORDER — EZETIMIBE 10 MG PO TABS: 10.0000 mg | ORAL_TABLET | Freq: Every day | ORAL | 3 refills | Status: DC

## 2016-08-01 MED ORDER — SILDENAFIL CITRATE 100 MG PO TABS: 100.0000 mg | ORAL_TABLET | Freq: Every day | ORAL | 11 refills | Status: DC | PRN

## 2016-08-01 MED ORDER — AMLODIPINE BESYLATE 10 MG PO TABS: ORAL_TABLET | ORAL | 2 refills | Status: DC

## 2016-08-01 MED ORDER — AMLODIPINE BESYLATE 10 MG PO TABS: ORAL_TABLET | ORAL | 0 refills | Status: DC

## 2016-08-01 NOTE — Telephone Encounter (Signed)
Pt has refilled his amLODipine (NORVASC) 10 MG tablet   He would like refills for a year put in. He would like a call back

## 2016-08-01 NOTE — Telephone Encounter (Signed)
Done

## 2016-08-01 NOTE — Progress Notes (Signed)
Subjective:    Patient ID: Frederick Hink., male    DOB: 13-Feb-1956, 61 y.o.   MRN: [id number]  HPI  Here for wellness and f/u;  Overall doing ok;  Pt denies Chest pain, worsening SOB, DOE, wheezing, orthopnea, PND, worsening LE edema, palpitations, dizziness or syncope.  Pt denies neurological change such as new headache, facial or extremity weakness.  Pt denies polydipsia, polyuria, or low sugar symptoms. Pt states overall good compliance with treatment and medications, good tolerability, and has been trying to follow appropriate diet.  Pt denies worsening depressive symptoms, suicidal ideation or panic. No fever, night sweats, wt loss, loss of appetite, or other constitutional symptoms.  Pt states good ability with ADL's, has low fall risk, home safety reviewed and adequate, no other significant changes in hearing or vision, and only occasionally active with exercise. Sounds like had neg stool cards sent him by [facility] last yr. Declines colonscopy.  Mild stressed today over brother with recent DM related PVD and toe amputations.  He has hx of HLD, and just does not want a statin due to brother with liver problems with statin.  No other hx change Past Medical History:  Diagnosis Date  . Aortic valve regurgitation 11/03/2011   Mild, with normal EF Nov 2007 echo  . CHEST PAIN, PLEURITIC 01/16/2007  . Cough 11/07/2007  . Emphysema 11/03/2011   Diffuse changes by CT chest 2005  . Erectile dysfunction 11/03/2011  . Eustachian tube dysfunction    Bilateral ear tubs for Eustachian Valve Dysfunction  . History of scarlet fever   . HYPERCHOLESTEROLEMIA 01/16/2007  . Hyperglycemia   . HYPERTENSION 09/14/2006  . Hypothyroidism 11/03/2011  . Raynaud's phenomenon   . SMOKER 10/06/2008  . THYROID NODULE, LEFT 11/07/2007   Past Surgical History:  Procedure Laterality Date  . NASAL SEPTUM SURGERY  1980  . THYROID LOBECTOMY  03/2005   left    reports that he has quit smoking. His smoking use included  Cigars. He has never used smokeless tobacco. He reports that he drinks alcohol. He reports that he does not use drugs. family history includes Hypertension in his father. Allergies  Allergen Reactions  . Ace Inhibitors   . Statins Other (See Comments)    Refusesdue to Family with problems with statins   Current Outpatient Prescriptions on File Prior to Visit  Medication Sig Dispense Refill  . aspirin 81 MG EC tablet Take 1 tablet (81 mg total) by mouth daily. Swallow whole. 30 tablet 12  . fish oil-omega-3 fatty acids 1000 MG capsule Take 1 capsule by mouth daily.      . Multiple Vitamin (MULTIVITAMIN) tablet Take 1 tablet by mouth daily.       No current facility-administered medications on file prior to visit.    Review of Systems  Constitutional: Negative for other unusual diaphoresis, sweats, appetite or weight changes HENT: Negative for other worsening hearing loss, ear pain, facial swelling, mouth sores or neck stiffness.   Eyes: Negative for other worsening pain, redness or other visual disturbance.  Respiratory: Negative for other stridor or swelling Cardiovascular: Negative for other palpitations or other chest pain  Gastrointestinal: Negative for worsening diarrhea or loose stools, blood in stool, distention or other pain Genitourinary: Negative for hematuria, flank pain or other change in urine volume.  Musculoskeletal: Negative for myalgias or other joint swelling.  Skin: Negative for other color change, or other wound or worsening drainage.  Neurological: Negative for other syncope or numbness. Hematological: Negative  for other adenopathy or swelling Psychiatric/Behavioral: Negative for hallucinations, other worsening agitation, SI, self-injury, or new decreased concentration All other system neg per pt    Objective:   Physical Exam BP 134/86   Pulse 71   Ht 6' (1.829 m)   Wt 164 lb (74.4 kg)   SpO2 98%   BMI 22.24 kg/m  VS noted,  Constitutional: Pt is oriented  to person, place, and time. Appears well-developed and well-nourished, in no significant distress and comfortable Head: Normocephalic and atraumatic  Eyes: Conjunctivae and EOM are normal. Pupils are equal, round, and reactive to light Right Ear: External ear normal without discharge Left Ear: External ear normal without discharge Nose: Nose without discharge or deformity Mouth/Throat: Oropharynx is without other ulcerations and moist  Neck: Normal range of motion. Neck supple. No JVD present. No tracheal deviation present or significant neck LA or mass Cardiovascular: Normal rate, regular rhythm, normal heart sounds and intact distal pulses.   Pulmonary/Chest: WOB normal and breath sounds without rales or wheezing  Abdominal: Soft. Bowel sounds are normal. NT. No HSM  Musculoskeletal: Normal range of motion. Exhibits no edema Lymphadenopathy: Has no other cervical adenopathy.  Neurological: Pt is alert and oriented to person, place, and time. Pt has normal reflexes. No cranial nerve deficit. Motor grossly intact, Gait intact Skin: Skin is warm and dry. No rash noted or new ulcerations, has scaly hardened lesion to left face at mid jawline < 1/2 cm, non ulcerated, superficial Psychiatric:  Has normal mood and affect. Behavior is normal without agitation S/p left partial thyroidectomy, right with ? Nodule No other exam findings     Assessment & Plan:

## 2016-08-01 NOTE — Patient Instructions (Signed)
Please quit smoking  Please call if you change your mind about the chest xray  Please continue all other medications as before, and refills have been done if requested.  Please have the pharmacy call with any other refills you may need.  Please continue your efforts at being more active, low cholesterol diet, and weight control.  You are otherwise up to date with prevention measures today.  Please keep your appointments with your specialists as you may have planned  You will be contacted regarding the referral for: Dermatology  Please go to the LAB in the Basement (turn left off the elevator) for the tests to be done today  You will be contacted by phone if any changes need to be made immediately.  Otherwise, you will receive a letter about your results with an explanation, but please check with MyChart first.  Please remember to sign up for MyChart if you have not done so, as this will be important to you in the future with finding out test results, communicating by private email, and scheduling acute appointments online when needed.  Please return in 1 year for your yearly visit, or sooner if needed, with Lab testing done 3-5 days before

## 2016-08-02 ENCOUNTER — Telehealth: Payer: Self-pay

## 2016-08-02 LAB — HIV ANTIBODY (ROUTINE TESTING W REFLEX): HIV 1&2 Ab, 4th Generation: NONREACTIVE

## 2016-08-02 LAB — HEPATITIS C ANTIBODY: HCV Ab: NEGATIVE

## 2016-08-02 NOTE — Telephone Encounter (Signed)
-----   Message from Biagio Borg, MD sent at 08/01/2016 12:48 PM EDT ----- Letter sent, cont same tx except  The test results show that your current treatment is OK, except the LDL cholesterol is high, that we discussed at the visit might happen.  You do not want to start a statin, so I would suggest Zetia 10 mg daily which is milder, but very easy to take and does not cause muscle pain or joint pain.  I will send a prescription, and you should be notified by the office as well.Redmond Baseman to please inform pt, I will do rx

## 2016-08-02 NOTE — Telephone Encounter (Signed)
Pt has been informed and expressed understanding.  

## 2016-08-04 NOTE — Assessment & Plan Note (Signed)
Declines f/u thyroid u/s as he believes no change

## 2016-08-04 NOTE — Assessment & Plan Note (Signed)
urged to quit 

## 2016-08-04 NOTE — Assessment & Plan Note (Signed)

## 2016-08-04 NOTE — Assessment & Plan Note (Signed)
Cant r/o skin ca, for derm referral

## 2016-12-08 DIAGNOSIS — Z23 Encounter for immunization: Secondary | ICD-10-CM | POA: Diagnosis not present

## 2017-05-01 DIAGNOSIS — L570 Actinic keratosis: Secondary | ICD-10-CM | POA: Diagnosis not present

## 2017-06-19 DIAGNOSIS — L570 Actinic keratosis: Secondary | ICD-10-CM | POA: Diagnosis not present

## 2017-09-04 DIAGNOSIS — L570 Actinic keratosis: Secondary | ICD-10-CM | POA: Diagnosis not present

## 2017-11-14 DIAGNOSIS — Z23 Encounter for immunization: Secondary | ICD-10-CM | POA: Diagnosis not present

## 2018-01-08 DIAGNOSIS — L814 Other melanin hyperpigmentation: Secondary | ICD-10-CM | POA: Diagnosis not present

## 2018-01-08 DIAGNOSIS — L570 Actinic keratosis: Secondary | ICD-10-CM | POA: Diagnosis not present

## 2018-01-08 DIAGNOSIS — L821 Other seborrheic keratosis: Secondary | ICD-10-CM | POA: Diagnosis not present

## 2018-01-08 DIAGNOSIS — D1801 Hemangioma of skin and subcutaneous tissue: Secondary | ICD-10-CM | POA: Diagnosis not present

## 2018-04-26 ENCOUNTER — Telehealth: Payer: Self-pay | Admitting: Internal Medicine

## 2018-04-26 ENCOUNTER — Other Ambulatory Visit: Payer: Self-pay | Admitting: Internal Medicine

## 2018-04-26 MED ORDER — AMLODIPINE BESYLATE 10 MG PO TABS: ORAL_TABLET | ORAL | 1 refills | Status: DC

## 2018-04-26 MED ORDER — AMLODIPINE BESYLATE 10 MG PO TABS: ORAL_TABLET | ORAL | 2 refills | Status: DC

## 2018-04-26 NOTE — Addendum Note (Signed)
Addended by: Juliet Rude on: 04/26/2018 02:09 PM   Modules accepted: Orders

## 2018-04-26 NOTE — Telephone Encounter (Signed)
Copied from [place] [phone]. Topic: Quick Communication - Rx Refill/Question >> Apr 26, 2018  8:47 AM Alanda Slim E wrote: Medication: amLODipine (NORVASC) 10 MG tablet - has appt for CPE on 4.2.20. will be needed medication to last until that appt date   Has the patient contacted their pharmacy? No   Preferred Pharmacy (with phone number or street name): [facility] [address], [place] - [place] [phone] (Phone) [phone] (Fax)    Agent: Please be advised that RX refills may take up to 3 business days. We ask that you follow-up with your pharmacy.

## 2018-04-26 NOTE — Telephone Encounter (Signed)
Requested medication (s) are due for refill today: yes  Requested medication (s) are on the active medication list: AML list dose as 10 mg- refill request is for 5 mg  Last refill:  08/01/16 #90 2 RF RX expired 04/26/18   Future visit scheduled: yes  Notes to clinic:  Please clarify dosage on refill    Requested Prescriptions  Pending Prescriptions Disp Refills   amLODipine (NORVASC) 10 MG tablet 90 tablet 2    Sig: TAKE 1TABLET (5 MG TOTAL) BY MOUTH DAILY.     Cardiovascular:  Calcium Channel Blockers Failed - 04/26/2018  8:51 AM      Failed - Valid encounter within last 6 months    Recent Outpatient Visits          1 year ago Routine general medical examination at a health care facility   [place], MD   3 years ago Routine general medical examination at a health care facility   [place], MD   5 years ago Rocky Basye, MD   5 years ago Routine general medical examination at a health care facility   [place], MD   6 years ago Routine general medical examination at a health care facility   [place], MD      Future Appointments            In 3 weeks Biagio Borg, MD [place], [place] BP in normal range    BP Readings from Last 1 Encounters:  08/01/16 134/86

## 2018-05-23 ENCOUNTER — Encounter: Payer: 59 | Admitting: Internal Medicine

## 2018-06-21 ENCOUNTER — Other Ambulatory Visit: Payer: Self-pay

## 2018-06-21 MED ORDER — AMLODIPINE BESYLATE 10 MG PO TABS: ORAL_TABLET | ORAL | 0 refills | Status: DC

## 2018-07-25 ENCOUNTER — Encounter: Payer: Self-pay | Admitting: Internal Medicine

## 2018-07-25 ENCOUNTER — Ambulatory Visit (INDEPENDENT_AMBULATORY_CARE_PROVIDER_SITE_OTHER): Payer: 59 | Admitting: Internal Medicine

## 2018-07-25 DIAGNOSIS — Z0000 Encounter for general adult medical examination without abnormal findings: Secondary | ICD-10-CM

## 2018-07-25 DIAGNOSIS — E039 Hypothyroidism, unspecified: Secondary | ICD-10-CM

## 2018-07-25 DIAGNOSIS — I10 Essential (primary) hypertension: Secondary | ICD-10-CM

## 2018-07-25 DIAGNOSIS — E7800 Pure hypercholesterolemia, unspecified: Secondary | ICD-10-CM | POA: Diagnosis not present

## 2018-07-25 MED ORDER — AMLODIPINE BESYLATE 10 MG PO TABS: ORAL_TABLET | ORAL | 1 refills | Status: DC

## 2018-07-25 MED ORDER — SILDENAFIL CITRATE 100 MG PO TABS: 100.0000 mg | ORAL_TABLET | Freq: Every day | ORAL | 11 refills | Status: DC | PRN

## 2018-07-25 NOTE — Patient Instructions (Signed)
Please continue all other medications as before, and refills have been done if requested.  Please have the pharmacy call with any other refills you may need.  Please continue your efforts at being more active, low cholesterol diet, and weight control..  Please keep your appointments with your specialists as you may have planned  Please return in 6 months, or sooner if needed, with Lab testing done 3-5 days before  

## 2018-07-25 NOTE — Assessment & Plan Note (Signed)
Pt encouraged to continue to follow BP at home and next visit

## 2018-07-25 NOTE — Assessment & Plan Note (Signed)
stable overall by history and exam, recent data reviewed with pt, and pt to continue medical treatment as before,  to f/u any worsening symptoms or concerns  

## 2018-07-25 NOTE — Progress Notes (Signed)
Patient ID: [name]., male   DOB: Sep 14, 1955, 63 y.o.   MRN: [id number]  Cumulative time during 7-day interval 17 min, there was not an associated office visit for this concern within a 7 day period.  Verbal consent for services obtained from patient prior to services given.  Names of all persons present for services: [name], MD, patient  Chief complaint: general medical  History, background, results pertinent:  Here to f/u; overall doing ok,  Pt denies chest pain, increasing sob or doe, wheezing, orthopnea, PND, increased LE swelling, palpitations, dizziness or syncope.  Pt denies new neurological symptoms such as new headache, or facial or extremity weakness or numbness.  Pt denies polydipsia, polyuria, or low sugar episode.  Pt states overall good compliance with meds, mostly trying to follow appropriate diet, with wt overall stable,  but little exercise however. Denies hyper or hypo thyroid symptoms such as voice, skin or hair change. Past Medical History:  Diagnosis Date  . Aortic valve regurgitation 11/03/2011   Mild, with normal EF Nov 2007 echo  . CHEST PAIN, PLEURITIC 01/16/2007  . Cough 11/07/2007  . Emphysema 11/03/2011   Diffuse changes by CT chest 2005  . Erectile dysfunction 11/03/2011  . Eustachian tube dysfunction    Bilateral ear tubs for Eustachian Valve Dysfunction  . History of scarlet fever   . HYPERCHOLESTEROLEMIA 01/16/2007  . Hyperglycemia   . HYPERTENSION 09/14/2006  . Hypothyroidism 11/03/2011  . Raynaud's phenomenon   . SMOKER 10/06/2008  . THYROID NODULE, LEFT 11/07/2007   No results found for this or any previous visit (from the past 48 hour(s)).  Current Outpatient Medications on File Prior to Visit  Medication Sig Dispense Refill  . aspirin 81 MG EC tablet Take 1 tablet (81 mg total) by mouth daily. Swallow whole. 30 tablet 12  . fish oil-omega-3 fatty acids 1000 MG capsule Take 1 capsule by mouth daily.      . Multiple Vitamin (MULTIVITAMIN)  tablet Take 1 tablet by mouth daily.       No current facility-administered medications on file prior to visit.    Lab Results  Component Value Date   WBC 11.5 (H) 08/01/2016   HGB 17.5 (H) 08/01/2016   HCT 52.0 08/01/2016   PLT 238.0 08/01/2016   GLUCOSE 94 08/01/2016   CHOL 216 (H) 08/01/2016   TRIG 142.0 08/01/2016   HDL 40.40 08/01/2016   LDLDIRECT 183.1 11/15/2012   LDLCALC 147 (H) 08/01/2016   ALT 15 08/01/2016   AST 21 08/01/2016   NA 137 08/01/2016   K 4.9 08/01/2016   CL 102 08/01/2016   CREATININE 0.97 08/01/2016   BUN 15 08/01/2016   CO2 30 08/01/2016   TSH 5.29 (H) 08/01/2016   PSA 0.54 08/01/2016   HGBA1C 5.7 03/27/2006    A/P/next steps:  See notes  [name] MD

## 2019-03-24 ENCOUNTER — Other Ambulatory Visit: Payer: Self-pay | Admitting: Internal Medicine

## 2019-08-21 ENCOUNTER — Ambulatory Visit (INDEPENDENT_AMBULATORY_CARE_PROVIDER_SITE_OTHER): Payer: 59 | Admitting: Internal Medicine

## 2019-08-21 ENCOUNTER — Other Ambulatory Visit: Payer: Self-pay

## 2019-08-21 ENCOUNTER — Encounter: Payer: Self-pay | Admitting: Internal Medicine

## 2019-08-21 VITALS — BP 140/70 | HR 58 | Temp 97.9°F | Ht 72.0 in | Wt 153.0 lb

## 2019-08-21 DIAGNOSIS — F17200 Nicotine dependence, unspecified, uncomplicated: Secondary | ICD-10-CM

## 2019-08-21 DIAGNOSIS — Z0000 Encounter for general adult medical examination without abnormal findings: Secondary | ICD-10-CM | POA: Diagnosis not present

## 2019-08-21 DIAGNOSIS — E7800 Pure hypercholesterolemia, unspecified: Secondary | ICD-10-CM | POA: Diagnosis not present

## 2019-08-21 DIAGNOSIS — E039 Hypothyroidism, unspecified: Secondary | ICD-10-CM | POA: Diagnosis not present

## 2019-08-21 LAB — BASIC METABOLIC PANEL
BUN: 14 mg/dL (ref 6–23)
CO2: 27 mEq/L (ref 19–32)
Calcium: 9.7 mg/dL (ref 8.4–10.5)
Chloride: 101 mEq/L (ref 96–112)
Creatinine, Ser: 0.97 mg/dL (ref 0.40–1.50)
GFR: 77.92 mL/min (ref >60.00)
Glucose, Bld: 94 mg/dL (ref 70–99)
Potassium: 4.5 mEq/L (ref 3.5–5.1)
Sodium: 138 mEq/L (ref 135–145)

## 2019-08-21 LAB — CBC WITH DIFFERENTIAL/PLATELET
Basophils Absolute: 0.1 10*3/uL (ref 0.0–0.1)
Basophils Relative: 0.5 % (ref 0.0–3.0)
Eosinophils Absolute: 0.3 10*3/uL (ref 0.0–0.7)
Eosinophils Relative: 2.6 % (ref 0.0–5.0)
HCT: 49.2 % (ref 39.0–52.0)
Hemoglobin: 16.5 g/dL (ref 13.0–17.0)
Lymphocytes Relative: 31.2 % (ref 12.0–46.0)
Lymphs Abs: 3.3 10*3/uL (ref 0.7–4.0)
MCHC: 33.6 g/dL (ref 30.0–36.0)
MCV: 98 fl (ref 78.0–100.0)
Monocytes Absolute: 0.8 10*3/uL (ref 0.1–1.0)
Monocytes Relative: 8.1 % (ref 3.0–12.0)
Neutro Abs: 6 10*3/uL (ref 1.4–7.7)
Neutrophils Relative %: 57.6 % (ref 43.0–77.0)
Platelets: 235 10*3/uL (ref 150.0–400.0)
RBC: 5.02 Mil/uL (ref 4.22–5.81)
RDW: 13.8 % (ref 11.5–15.5)
WBC: 10.4 10*3/uL (ref 4.0–10.5)

## 2019-08-21 LAB — LIPID PANEL
Cholesterol: 218 mg/dL — ABNORMAL HIGH (ref 0–200)
HDL: 45.4 mg/dL (ref >39.00)
LDL Cholesterol: 148 mg/dL — ABNORMAL HIGH (ref 0–99)
NonHDL: 172.92
Total CHOL/HDL Ratio: 5
Triglycerides: 123 mg/dL (ref 0.0–149.0)
VLDL: 24.6 mg/dL (ref 0.0–40.0)

## 2019-08-21 LAB — URINALYSIS, ROUTINE W REFLEX MICROSCOPIC
Bilirubin Urine: NEGATIVE
Ketones, ur: NEGATIVE
Leukocytes,Ua: NEGATIVE
Nitrite: NEGATIVE
RBC / HPF: NONE SEEN (ref 0–?)
Specific Gravity, Urine: <=1.005 — AB (ref 1.000–1.030)
Total Protein, Urine: NEGATIVE
Urine Glucose: NEGATIVE
Urobilinogen, UA: 0.2 (ref 0.0–1.0)
WBC, UA: NONE SEEN (ref 0–?)
pH: 6 (ref 5.0–8.0)

## 2019-08-21 LAB — PSA: PSA: 0.46 ng/mL (ref 0.10–4.00)

## 2019-08-21 LAB — HEPATIC FUNCTION PANEL
ALT: 11 U/L (ref 0–53)
AST: 19 U/L (ref 0–37)
Albumin: 4.6 g/dL (ref 3.5–5.2)
Alkaline Phosphatase: 97 U/L (ref 39–117)
Bilirubin, Direct: 0.1 mg/dL (ref 0.0–0.3)
Total Bilirubin: 0.7 mg/dL (ref 0.2–1.2)
Total Protein: 7.6 g/dL (ref 6.0–8.3)

## 2019-08-21 LAB — TSH: TSH: 7.11 u[IU]/mL — ABNORMAL HIGH (ref 0.35–4.50)

## 2019-08-21 MED ORDER — SILDENAFIL CITRATE 100 MG PO TABS: 100.0000 mg | ORAL_TABLET | Freq: Every day | ORAL | 11 refills | Status: DC | PRN

## 2019-08-21 NOTE — Patient Instructions (Signed)
Please continue all other medications as before, and refills have been done if requested.  Please have the pharmacy call with any other refills you may need.  Please continue your efforts at being more active, low cholesterol diet, and weight control.  You are otherwise up to date with prevention measures today.  Please keep your appointments with your specialists as you may have planned  Please call if you change your mind about the Pulmonary referral for the yearly CT scan for lung cancer screening  Please go to the LAB at the blood drawing area for the tests to be done  You will be contacted by phone if any changes need to be made immediately.  Otherwise, you will receive a letter about your results with an explanation, but please check with MyChart first.  Please remember to sign up for MyChart if you have not done so, as this will be important to you in the future with finding out test results, communicating by private email, and scheduling acute appointments online when needed.  Please make an Appointment to return for your 1 year visit, or sooner if needed

## 2019-08-21 NOTE — Progress Notes (Signed)
Subjective:    Patient ID: Frederick Scott., male    DOB: 05-01-1955, 64 y.o.   MRN: [id number]  HPI   Here for wellness and f/u;  Overall doing ok;  Pt denies Chest pain, worsening SOB, DOE, wheezing, orthopnea, PND, worsening LE edema, palpitations, dizziness or syncope.  Pt denies neurological change such as new headache, facial or extremity weakness.  Pt denies polydipsia, polyuria, or low sugar symptoms. Pt states overall good compliance with treatment and medications, good tolerability, and has been trying to follow appropriate diet.  Pt denies worsening depressive symptoms, suicidal ideation or panic. No fever, night sweats, wt loss, loss of appetite, or other constitutional symptoms.  Pt states good ability with ADL's, has low fall risk, home safety reviewed and adequate, no other significant changes in hearing or vision, and only occasionally active with exercise.  Still smoking, not ready to quit, does not want referal pulm for ldCT screening for now.  Denies hyper or hypo thyroid symptoms such as voice, skin or hair change.  Does not want statin tx for now Past Medical History:  Diagnosis Date  . Aortic valve regurgitation 11/03/2011   Mild, with normal EF Nov 2007 echo  . CHEST PAIN, PLEURITIC 01/16/2007  . Cough 11/07/2007  . Emphysema 11/03/2011   Diffuse changes by CT chest 2005  . Erectile dysfunction 11/03/2011  . Eustachian tube dysfunction    Bilateral ear tubs for Eustachian Valve Dysfunction  . History of scarlet fever   . HYPERCHOLESTEROLEMIA 01/16/2007  . Hyperglycemia   . HYPERTENSION 09/14/2006  . Hypothyroidism 11/03/2011  . Raynaud's phenomenon   . SMOKER 10/06/2008  . THYROID NODULE, LEFT 11/07/2007   Past Surgical History:  Procedure Laterality Date  . NASAL SEPTUM SURGERY  1980  . THYROID LOBECTOMY  03/2005   left    reports that he has quit smoking. His smoking use included cigars. He has never used smokeless tobacco. He reports current alcohol use. He  reports that he does not use drugs. family history includes Hypertension in his father. Allergies  Allergen Reactions  . Ace Inhibitors   . Statins Other (See Comments)    Refusesdue to Family with problems with statins   Current Outpatient Medications on File Prior to Visit  Medication Sig Dispense Refill  . amLODipine (NORVASC) 10 MG tablet TAKE ONE TABLET BY MOUTH DAILY Annual appt due in June must see provider for future refills 90 tablet 1  . aspirin 81 MG EC tablet Take 1 tablet (81 mg total) by mouth daily. Swallow whole. 30 tablet 12  . fish oil-omega-3 fatty acids 1000 MG capsule Take 1 capsule by mouth daily.      . Multiple Vitamin (MULTIVITAMIN) tablet Take 1 tablet by mouth daily.       No current facility-administered medications on file prior to visit.   Review of Systems All otherwise neg per pt    Objective:   Physical Exam BP 140/70 (BP Location: Left Arm, Patient Position: Sitting, Cuff Size: Large)   Pulse (!) 58   Temp 97.9 F (36.6 C) (Oral)   Ht 6' (1.829 m)   Wt 153 lb (69.4 kg)   SpO2 93%   BMI 20.75 kg/m  VS noted,  Constitutional: Pt appears in NAD HENT: Head: NCAT.  Right Ear: External ear normal.  Left Ear: External ear normal.  Eyes: . Pupils are equal, round, and reactive to light. Conjunctivae and EOM are normal Nose: without d/c or deformity Neck: Neck  supple. Gross normal ROM Cardiovascular: Normal rate and regular rhythm.   Pulmonary/Chest: Effort normal and breath sounds without rales or wheezing.  Abd:  Soft, NT, ND, + BS, no organomegaly Neurological: Pt is alert. At baseline orientation, motor grossly intact Skin: Skin is warm. No rashes, other new lesions, no LE edema Psychiatric: Pt behavior is normal without agitation  All otherwise neg per pt Lab Results  Component Value Date   WBC 10.4 08/21/2019   HGB 16.5 08/21/2019   HCT 49.2 08/21/2019   PLT 235.0 08/21/2019   GLUCOSE 94 08/21/2019   CHOL 218 (H) 08/21/2019   TRIG  123.0 08/21/2019   HDL 45.40 08/21/2019   LDLDIRECT 183.1 11/15/2012   LDLCALC 148 (H) 08/21/2019   ALT 11 08/21/2019   AST 19 08/21/2019   NA 138 08/21/2019   K 4.5 08/21/2019   CL 101 08/21/2019   CREATININE 0.97 08/21/2019   BUN 14 08/21/2019   CO2 27 08/21/2019   TSH 7.11 (H) 08/21/2019   PSA 0.46 08/21/2019   HGBA1C 5.7 03/27/2006      Assessment & Plan:

## 2019-08-22 ENCOUNTER — Encounter: Payer: Self-pay | Admitting: Internal Medicine

## 2019-08-22 ENCOUNTER — Other Ambulatory Visit: Payer: Self-pay | Admitting: Internal Medicine

## 2019-08-22 MED ORDER — LEVOTHYROXINE SODIUM 25 MCG PO TABS: 25.0000 ug | ORAL_TABLET | Freq: Every day | ORAL | 3 refills | Status: DC

## 2019-08-24 ENCOUNTER — Encounter: Payer: Self-pay | Admitting: Internal Medicine

## 2019-08-24 NOTE — Assessment & Plan Note (Signed)
Urged to quit 

## 2019-08-24 NOTE — Assessment & Plan Note (Signed)

## 2019-08-24 NOTE — Assessment & Plan Note (Signed)
For f/u tsh, may need med tx

## 2019-08-24 NOTE — Assessment & Plan Note (Signed)
For lower chol diet, does not want statin

## 2019-08-26 ENCOUNTER — Telehealth: Payer: Self-pay

## 2019-08-26 NOTE — Telephone Encounter (Signed)
Patient calling and would like most recent lab results printed out and mailed to address on file.

## 2019-08-27 NOTE — Telephone Encounter (Signed)
Letter has been sent out on 08/22/2019.

## 2019-09-05 ENCOUNTER — Telehealth: Payer: Self-pay

## 2019-09-05 NOTE — Telephone Encounter (Addendum)
Pt called & confirmed that he rec'd most recent test results in mail. Informed that "the test results show that your current treatment is OK, as the tests are stable , except the LDL cholesterol is still mildly elevated, and the thyroid testing shows you have new mild low thyroid condition. Please continue to follow a lower cholesterol diet, and also start levothyroxine 25 mcg per day for the low thyroid condition."  Pt confirmed that he is taking the levothryroxine as directed.  Denies ques/concerns at this timel

## 2019-09-05 NOTE — Telephone Encounter (Signed)
-----   Message from Biagio Borg, MD sent at 08/22/2019  1:17 PM EDT ----- Letter sent, cont same tx except  The test results show that your current treatment is OK, as the tests are stable , except the LDL cholesterol is still mildly elevated, and the thyroid testing shows you have new mild low thyroid condition.  Please continue to follow a lower cholesterol diet, and also start levothyroxine 25 mcg per day for the low thyroid condition.  I will send the prescription, and you should hear from the office as well  Staff to please inform pt, I will do rx x 1

## 2020-03-16 ENCOUNTER — Other Ambulatory Visit: Payer: Self-pay | Admitting: Internal Medicine

## 2020-03-16 NOTE — Telephone Encounter (Signed)
Please refill as per office routine med refill policy (all routine meds refilled for 3 mo or monthly per pt preference up to one year from last visit, then month to month grace period for 3 mo, then further med refills will have to be denied)  

## 2020-08-20 ENCOUNTER — Other Ambulatory Visit: Payer: Self-pay | Admitting: Internal Medicine

## 2020-08-20 NOTE — Telephone Encounter (Signed)
Please refill as per office routine med refill policy (all routine meds refilled for 3 mo or monthly per pt preference up to one year from last visit, then month to month grace period for 3 mo, then further med refills will have to be denied)  

## 2020-09-10 ENCOUNTER — Other Ambulatory Visit: Payer: Self-pay

## 2020-09-10 ENCOUNTER — Ambulatory Visit (INDEPENDENT_AMBULATORY_CARE_PROVIDER_SITE_OTHER): Payer: Medicare Other | Admitting: Internal Medicine

## 2020-09-10 ENCOUNTER — Encounter: Payer: Self-pay | Admitting: Internal Medicine

## 2020-09-10 VITALS — BP 122/70 | HR 63 | Temp 97.7°F | Resp 18 | Ht 72.0 in | Wt 155.8 lb

## 2020-09-10 DIAGNOSIS — E7800 Pure hypercholesterolemia, unspecified: Secondary | ICD-10-CM

## 2020-09-10 DIAGNOSIS — E039 Hypothyroidism, unspecified: Secondary | ICD-10-CM | POA: Diagnosis not present

## 2020-09-10 DIAGNOSIS — Z125 Encounter for screening for malignant neoplasm of prostate: Secondary | ICD-10-CM | POA: Diagnosis not present

## 2020-09-10 DIAGNOSIS — Z0001 Encounter for general adult medical examination with abnormal findings: Secondary | ICD-10-CM

## 2020-09-10 DIAGNOSIS — I10 Essential (primary) hypertension: Secondary | ICD-10-CM | POA: Diagnosis not present

## 2020-09-10 DIAGNOSIS — F17200 Nicotine dependence, unspecified, uncomplicated: Secondary | ICD-10-CM | POA: Diagnosis not present

## 2020-09-10 DIAGNOSIS — E559 Vitamin D deficiency, unspecified: Secondary | ICD-10-CM

## 2020-09-10 DIAGNOSIS — E538 Deficiency of other specified B group vitamins: Secondary | ICD-10-CM | POA: Diagnosis not present

## 2020-09-10 LAB — CBC WITH DIFFERENTIAL/PLATELET
Basophils Absolute: 0.1 10*3/uL (ref 0.0–0.1)
Basophils Relative: 0.5 % (ref 0.0–3.0)
Eosinophils Absolute: 0.2 10*3/uL (ref 0.0–0.7)
Eosinophils Relative: 2.1 % (ref 0.0–5.0)
HCT: 50.6 % (ref 39.0–52.0)
Hemoglobin: 16.9 g/dL (ref 13.0–17.0)
Lymphocytes Relative: 28.8 % (ref 12.0–46.0)
Lymphs Abs: 2.9 10*3/uL (ref 0.7–4.0)
MCHC: 33.3 g/dL (ref 30.0–36.0)
MCV: 97.6 fl (ref 78.0–100.0)
Monocytes Absolute: 0.8 10*3/uL (ref 0.1–1.0)
Monocytes Relative: 8.1 % (ref 3.0–12.0)
Neutro Abs: 6.2 10*3/uL (ref 1.4–7.7)
Neutrophils Relative %: 60.5 % (ref 43.0–77.0)
Platelets: 245 10*3/uL (ref 150.0–400.0)
RBC: 5.18 Mil/uL (ref 4.22–5.81)
RDW: 13.8 % (ref 11.5–15.5)
WBC: 10.2 10*3/uL (ref 4.0–10.5)

## 2020-09-10 LAB — BASIC METABOLIC PANEL
BUN: 11 mg/dL (ref 6–23)
CO2: 29 mEq/L (ref 19–32)
Calcium: 9.9 mg/dL (ref 8.4–10.5)
Chloride: 100 mEq/L (ref 96–112)
Creatinine, Ser: 0.88 mg/dL (ref 0.40–1.50)
GFR: 90.54 mL/min (ref >60.00)
Glucose, Bld: 81 mg/dL (ref 70–99)
Potassium: 4.5 mEq/L (ref 3.5–5.1)
Sodium: 139 mEq/L (ref 135–145)

## 2020-09-10 LAB — URINALYSIS, ROUTINE W REFLEX MICROSCOPIC
Bilirubin Urine: NEGATIVE
Hgb urine dipstick: NEGATIVE
Ketones, ur: NEGATIVE
Leukocytes,Ua: NEGATIVE
Nitrite: NEGATIVE
RBC / HPF: NONE SEEN (ref 0–?)
Specific Gravity, Urine: <=1.005 — AB (ref 1.000–1.030)
Total Protein, Urine: NEGATIVE
Urine Glucose: NEGATIVE
Urobilinogen, UA: 0.2 (ref 0.0–1.0)
pH: 6 (ref 5.0–8.0)

## 2020-09-10 LAB — LIPID PANEL
Cholesterol: 222 mg/dL — ABNORMAL HIGH (ref 0–200)
HDL: 47.9 mg/dL (ref >39.00)
LDL Cholesterol: 151 mg/dL — ABNORMAL HIGH (ref 0–99)
NonHDL: 174.22
Total CHOL/HDL Ratio: 5
Triglycerides: 116 mg/dL (ref 0.0–149.0)
VLDL: 23.2 mg/dL (ref 0.0–40.0)

## 2020-09-10 LAB — HEPATIC FUNCTION PANEL
ALT: 13 U/L (ref 0–53)
AST: 19 U/L (ref 0–37)
Albumin: 4.5 g/dL (ref 3.5–5.2)
Alkaline Phosphatase: 108 U/L (ref 39–117)
Bilirubin, Direct: 0.1 mg/dL (ref 0.0–0.3)
Total Bilirubin: 0.7 mg/dL (ref 0.2–1.2)
Total Protein: 7.6 g/dL (ref 6.0–8.3)

## 2020-09-10 LAB — VITAMIN B12: Vitamin B-12: 657 pg/mL (ref 211–911)

## 2020-09-10 LAB — PSA: PSA: 0.52 ng/mL (ref 0.10–4.00)

## 2020-09-10 LAB — T4, FREE: Free T4: 1.01 ng/dL (ref 0.60–1.60)

## 2020-09-10 LAB — TSH: TSH: 5.19 u[IU]/mL (ref 0.35–5.50)

## 2020-09-10 LAB — VITAMIN D 25 HYDROXY (VIT D DEFICIENCY, FRACTURES): VITD: 50.41 ng/mL (ref 30.00–100.00)

## 2020-09-10 MED ORDER — SILDENAFIL CITRATE 100 MG PO TABS: 100.0000 mg | ORAL_TABLET | Freq: Every day | ORAL | 11 refills | Status: DC | PRN

## 2020-09-10 MED ORDER — AMLODIPINE BESYLATE 10 MG PO TABS: ORAL_TABLET | ORAL | 3 refills | Status: DC

## 2020-09-10 MED ORDER — LEVOTHYROXINE SODIUM 25 MCG PO TABS: 25.0000 ug | ORAL_TABLET | Freq: Every day | ORAL | 3 refills | Status: DC

## 2020-09-10 NOTE — Progress Notes (Signed)
Patient ID: Frederick Bosshart., male   DOB: August 11, 1955, 65 y.o.   MRN: [id number]         Chief Complaint:: wellness exam and Annual Exam (Needs all refills written, changing pharmacy. ) , hld, hypothyroid,  smoking, htn       HPI:  Frederick Schult. is a 65 y.o. male here for wellness exam; declines covid booster, shingrix, colonoscopy; o/w up to date with preventive referrals and immunizations                        Also Denies hyper or hypo thyroid symptoms such as voice, skin or hair change.  Has gone back to smoking, not ready to quit.  Trying to follow lower cholesterol diet, not sure if wants to start statin.  Pt denies chest pain, increased sob or doe, wheezing, orthopnea, PND, increased LE swelling, palpitations, dizziness or syncope.   Pt denies polydipsia, polyuria, or new focal neuro s/s.   Pt denies fever, wt loss, night sweats, loss of appetite, or other constitutional symptoms  No other new complaints   Wt Readings from Last 3 Encounters:  09/10/20 155 lb 12.8 oz (70.7 kg)  08/21/19 153 lb (69.4 kg)  08/01/16 164 lb (74.4 kg)   BP Readings from Last 3 Encounters:  09/10/20 122/70  08/21/19 140/70  08/01/16 134/86   Immunization History  Administered Date(s) Administered   Influenza Whole 01/03/2008   Influenza,inj,Quad PF,6+ Mos 11/14/2017   Influenza-Unspecified 12/21/2017   PFIZER(Purple Top)SARS-COV-2 Vaccination 05/22/2019, 06/09/2019, 11/24/2019   Pneumococcal Conjugate-13 02/20/2018   Pneumococcal Polysaccharide-23 12/11/2017   Td 12/27/2005   Tdap 03/25/2015   There are no preventive care reminders to display for this patient.     Past Medical History:  Diagnosis Date   Aortic valve regurgitation 11/03/2011   Mild, with normal EF Nov 2007 echo   CHEST PAIN, PLEURITIC 01/16/2007   Cough 11/07/2007   Emphysema 11/03/2011   Diffuse changes by CT chest 2005   Erectile dysfunction 11/03/2011   Eustachian tube dysfunction    Bilateral ear tubs for  Eustachian Valve Dysfunction   History of scarlet fever    HYPERCHOLESTEROLEMIA 01/16/2007   Hyperglycemia    HYPERTENSION 09/14/2006   Hypothyroidism 11/03/2011   Raynaud's phenomenon    SMOKER 10/06/2008   THYROID NODULE, LEFT 11/07/2007   Past Surgical History:  Procedure Laterality Date   [place]   THYROID LOBECTOMY  03/2005   left    reports that he has quit smoking. His smoking use included cigars. He has never used smokeless tobacco. He reports current alcohol use. He reports that he does not use drugs. family history includes Hypertension in his father. Allergies  Allergen Reactions   Ace Inhibitors    Statins Other (See Comments)    Refusesdue to Family with problems with statins   Current Outpatient Medications on File Prior to Visit  Medication Sig Dispense Refill   aspirin 81 MG EC tablet Take 1 tablet (81 mg total) by mouth daily. Swallow whole. 30 tablet 12   fish oil-omega-3 fatty acids 1000 MG capsule Take 1 capsule by mouth daily.       Multiple Vitamin (MULTIVITAMIN) tablet Take 1 tablet by mouth daily.       No current facility-administered medications on file prior to visit.        ROS:  All others reviewed and negative.  Objective  PE:  BP 122/70   Pulse 63   Temp 97.7 F (36.5 C) (Oral)   Resp 18   Ht 6' (1.829 m)   Wt 155 lb 12.8 oz (70.7 kg)   SpO2 99%   BMI 21.13 kg/m                 Constitutional: Pt appears in NAD               HENT: Head: NCAT.                Right Ear: External ear normal.                 Left Ear: External ear normal.                Eyes: . Pupils are equal, round, and reactive to light. Conjunctivae and EOM are normal               Nose: without d/c or deformity               Neck: Neck supple. Gross normal ROM               Cardiovascular: Normal rate and regular rhythm.                 Pulmonary/Chest: Effort normal and breath sounds without rales or wheezing.                Abd:  Soft, NT,  ND, + BS, no organomegaly               Neurological: Pt is alert. At baseline orientation, motor grossly intact               Skin: Skin is warm. No rashes, no other new lesions, LE edema - none               Psychiatric: Pt behavior is normal without agitation   Micro: none  Cardiac tracings I have personally interpreted today:  none  Pertinent Radiological findings (summarize): none   Lab Results  Component Value Date   WBC 10.2 09/10/2020   HGB 16.9 09/10/2020   HCT 50.6 09/10/2020   PLT 245.0 09/10/2020   GLUCOSE 81 09/10/2020   CHOL 222 (H) 09/10/2020   TRIG 116.0 09/10/2020   HDL 47.90 09/10/2020   LDLDIRECT 183.1 11/15/2012   LDLCALC 151 (H) 09/10/2020   ALT 13 09/10/2020   AST 19 09/10/2020   NA 139 09/10/2020   K 4.5 09/10/2020   CL 100 09/10/2020   CREATININE 0.88 09/10/2020   BUN 11 09/10/2020   CO2 29 09/10/2020   TSH 5.19 09/10/2020   PSA 0.52 09/10/2020   HGBA1C 5.7 03/27/2006   Assessment/Plan:  Frederick Witteman. is a 65 y.o. White or Caucasian [1] male with  has a past medical history of Aortic valve regurgitation (11/03/2011), CHEST PAIN, PLEURITIC (01/16/2007), Cough (11/07/2007), Emphysema (11/03/2011), Erectile dysfunction (11/03/2011), Eustachian tube dysfunction, History of scarlet fever, HYPERCHOLESTEROLEMIA (01/16/2007), Hyperglycemia, HYPERTENSION (09/14/2006), Hypothyroidism (11/03/2011), Raynaud's phenomenon, SMOKER (10/06/2008), and THYROID NODULE, LEFT (11/07/2007).  Hypothyroidism Lab Results  Component Value Date   TSH 7.11 (H) 08/21/2019   Uncontrolled, now on low dose levothyroxine - now for f/u labs   Encounter for well adult exam with abnormal findings Age and sex appropriate education and counseling updated with regular exercise and diet Referrals for preventative services - declines colonoscopy Immunizations addressed - declines covid boostser, shingrix Smoking counseling  -  counseled to quit, pt not ready Evidence for depression or  other mood disorder - none significant Most recent labs reviewed. I have personally reviewed and have noted: 1) the patient's medical and social history 2) The patient's current medications and supplements 3) The patient's height, weight, and BMI have been recorded in the chart   Smoker Counseled to quit, pt not ready, declines referral for LDCT program for now  HYPERCHOLESTEROLEMIA Lab Results  Component Value Date   LDLCALC 151 (H) 09/10/2020   Uncontrolled, goal ldl < 100, pt to continue current low chol diet, declines statin for now   Essential hypertension BP Readings from Last 3 Encounters:  09/10/20 122/70  08/21/19 140/70  08/01/16 134/86   Stable, pt to continue medical treatment amlodipine  Followup: No follow-ups on file.  [name], MD 09/11/2020 4:48 PM [facility]

## 2020-09-10 NOTE — Patient Instructions (Addendum)
Please check the insurance coverage for the Shingles shot  Please continue all other medications as before, and refills have been done if requested with the paper prescriptions  Please call to let us know your new pharmacy soon  Please have the pharmacy call with any other refills you may need.  Please continue your efforts at being more active, low cholesterol diet, and weight control.  You are otherwise up to date with prevention measures today.  Please keep your appointments with your specialists as you may have planned  Please call if you change your mind about the Pulmonary referral to get signed up for the LDCT (low dose CT scan) program to screen for lung cancer)  Please go to the LAB at the blood drawing area for the tests to be done  You will be contacted by phone if any changes need to be made immediately.  Otherwise, you will receive a letter about your results with an explanation, but please check with MyChart first.  Please remember to sign up for MyChart if you have not done so, as this will be important to you in the future with finding out test results, communicating by private email, and scheduling acute appointments online when needed.  Please make an Appointment to return for your 1 year visit, or sooner if needed, with Lab testing by Appointment as well, to be done about 3-5 days before at the [facility] (so this is for TWO appointments - please see the scheduling desk as you leave)  Due to the ongoing Covid 19 pandemic, our lab now requires an appointment for any labs done at our office.  If you need labs done and do not have an appointment, please call our office ahead of time to schedule before presenting to the lab for your testing.

## 2020-09-10 NOTE — Assessment & Plan Note (Signed)
Lab Results  Component Value Date   TSH 7.11 (H) 08/21/2019   Uncontrolled, now on low dose levothyroxine - now for f/u labs

## 2020-09-11 NOTE — Assessment & Plan Note (Signed)
Lab Results  Component Value Date   LDLCALC 151 (H) 09/10/2020   Uncontrolled, goal ldl < 100, pt to continue current low chol diet, declines statin for now

## 2020-09-11 NOTE — Assessment & Plan Note (Signed)
Counseled to quit, pt not ready, declines referral for LDCT program for now

## 2020-09-11 NOTE — Assessment & Plan Note (Signed)
BP Readings from Last 3 Encounters:  09/10/20 122/70  08/21/19 140/70  08/01/16 134/86   Stable, pt to continue medical treatment amlodipine

## 2020-09-11 NOTE — Assessment & Plan Note (Signed)
Age and sex appropriate education and counseling updated with regular exercise and diet Referrals for preventative services - declines colonoscopy Immunizations addressed - declines covid boostser, shingrix Smoking counseling  - counseled to quit, pt not ready Evidence for depression or other mood disorder - none significant Most recent labs reviewed. I have personally reviewed and have noted: 1) the patient's medical and social history 2) The patient's current medications and supplements 3) The patient's height, weight, and BMI have been recorded in the chart

## 2020-09-12 ENCOUNTER — Encounter: Payer: Self-pay | Admitting: Internal Medicine

## 2021-05-12 DIAGNOSIS — Z23 Encounter for immunization: Secondary | ICD-10-CM | POA: Diagnosis not present

## 2021-05-12 DIAGNOSIS — L570 Actinic keratosis: Secondary | ICD-10-CM | POA: Diagnosis not present

## 2021-05-12 DIAGNOSIS — L820 Inflamed seborrheic keratosis: Secondary | ICD-10-CM | POA: Diagnosis not present

## 2021-09-09 ENCOUNTER — Other Ambulatory Visit: Payer: Self-pay | Admitting: Internal Medicine

## 2021-09-09 NOTE — Telephone Encounter (Signed)
Please refill as per office routine med refill policy (all routine meds to be refilled for 3 mo or monthly (per pt preference) up to one year from last visit, then month to month grace period for 3 mo, then further med refills will have to be denied) ? ?

## 2021-09-13 DIAGNOSIS — L814 Other melanin hyperpigmentation: Secondary | ICD-10-CM | POA: Diagnosis not present

## 2021-09-13 DIAGNOSIS — D225 Melanocytic nevi of trunk: Secondary | ICD-10-CM | POA: Diagnosis not present

## 2021-09-13 DIAGNOSIS — L578 Other skin changes due to chronic exposure to nonionizing radiation: Secondary | ICD-10-CM | POA: Diagnosis not present

## 2021-09-13 DIAGNOSIS — L821 Other seborrheic keratosis: Secondary | ICD-10-CM | POA: Diagnosis not present

## 2021-09-15 ENCOUNTER — Encounter: Payer: Self-pay | Admitting: Internal Medicine

## 2021-09-15 ENCOUNTER — Ambulatory Visit (INDEPENDENT_AMBULATORY_CARE_PROVIDER_SITE_OTHER): Payer: Medicare Other | Admitting: Internal Medicine

## 2021-09-15 VITALS — BP 172/90 | HR 72 | Temp 97.7°F | Ht 72.0 in | Wt 155.4 lb

## 2021-09-15 DIAGNOSIS — E559 Vitamin D deficiency, unspecified: Secondary | ICD-10-CM

## 2021-09-15 DIAGNOSIS — E7800 Pure hypercholesterolemia, unspecified: Secondary | ICD-10-CM

## 2021-09-15 DIAGNOSIS — E039 Hypothyroidism, unspecified: Secondary | ICD-10-CM | POA: Diagnosis not present

## 2021-09-15 DIAGNOSIS — F17200 Nicotine dependence, unspecified, uncomplicated: Secondary | ICD-10-CM

## 2021-09-15 DIAGNOSIS — Z1212 Encounter for screening for malignant neoplasm of rectum: Secondary | ICD-10-CM

## 2021-09-15 DIAGNOSIS — I10 Essential (primary) hypertension: Secondary | ICD-10-CM

## 2021-09-15 DIAGNOSIS — Z1211 Encounter for screening for malignant neoplasm of colon: Secondary | ICD-10-CM

## 2021-09-15 DIAGNOSIS — E538 Deficiency of other specified B group vitamins: Secondary | ICD-10-CM | POA: Diagnosis not present

## 2021-09-15 DIAGNOSIS — Z0001 Encounter for general adult medical examination with abnormal findings: Secondary | ICD-10-CM

## 2021-09-15 DIAGNOSIS — Z125 Encounter for screening for malignant neoplasm of prostate: Secondary | ICD-10-CM

## 2021-09-15 LAB — CBC WITH DIFFERENTIAL/PLATELET
Basophils Absolute: 0.1 10*3/uL (ref 0.0–0.1)
Basophils Relative: 0.8 % (ref 0.0–3.0)
Eosinophils Absolute: 0.1 10*3/uL (ref 0.0–0.7)
Eosinophils Relative: 1.8 % (ref 0.0–5.0)
HCT: 49 % (ref 39.0–52.0)
Hemoglobin: 16.3 g/dL (ref 13.0–17.0)
Lymphocytes Relative: 29.5 % (ref 12.0–46.0)
Lymphs Abs: 2.5 10*3/uL (ref 0.7–4.0)
MCHC: 33.3 g/dL (ref 30.0–36.0)
MCV: 97.5 fl (ref 78.0–100.0)
Monocytes Absolute: 0.6 10*3/uL (ref 0.1–1.0)
Monocytes Relative: 7.6 % (ref 3.0–12.0)
Neutro Abs: 5.1 10*3/uL (ref 1.4–7.7)
Neutrophils Relative %: 60.3 % (ref 43.0–77.0)
Platelets: 225 10*3/uL (ref 150.0–400.0)
RBC: 5.03 Mil/uL (ref 4.22–5.81)
RDW: 13.7 % (ref 11.5–15.5)
WBC: 8.4 10*3/uL (ref 4.0–10.5)

## 2021-09-15 LAB — URINALYSIS, ROUTINE W REFLEX MICROSCOPIC
Bilirubin Urine: NEGATIVE
Hgb urine dipstick: NEGATIVE
Ketones, ur: NEGATIVE
Leukocytes,Ua: NEGATIVE
Nitrite: NEGATIVE
RBC / HPF: NONE SEEN (ref 0–?)
Specific Gravity, Urine: <=1.005 — AB (ref 1.000–1.030)
Total Protein, Urine: NEGATIVE
Urine Glucose: NEGATIVE
Urobilinogen, UA: 0.2 (ref 0.0–1.0)
pH: 6.5 (ref 5.0–8.0)

## 2021-09-15 LAB — LIPID PANEL
Cholesterol: 239 mg/dL — ABNORMAL HIGH (ref 0–200)
HDL: 46.9 mg/dL (ref >39.00)
LDL Cholesterol: 169 mg/dL — ABNORMAL HIGH (ref 0–99)
NonHDL: 191.77
Total CHOL/HDL Ratio: 5
Triglycerides: 116 mg/dL (ref 0.0–149.0)
VLDL: 23.2 mg/dL (ref 0.0–40.0)

## 2021-09-15 LAB — BASIC METABOLIC PANEL
BUN: 14 mg/dL (ref 6–23)
CO2: 30 mEq/L (ref 19–32)
Calcium: 9.5 mg/dL (ref 8.4–10.5)
Chloride: 101 mEq/L (ref 96–112)
Creatinine, Ser: 0.92 mg/dL (ref 0.40–1.50)
GFR: 86.96 mL/min (ref >60.00)
Glucose, Bld: 96 mg/dL (ref 70–99)
Potassium: 4.3 mEq/L (ref 3.5–5.1)
Sodium: 138 mEq/L (ref 135–145)

## 2021-09-15 LAB — HEPATIC FUNCTION PANEL
ALT: 12 U/L (ref 0–53)
AST: 19 U/L (ref 0–37)
Albumin: 4.5 g/dL (ref 3.5–5.2)
Alkaline Phosphatase: 88 U/L (ref 39–117)
Bilirubin, Direct: 0.1 mg/dL (ref 0.0–0.3)
Total Bilirubin: 0.8 mg/dL (ref 0.2–1.2)
Total Protein: 7.5 g/dL (ref 6.0–8.3)

## 2021-09-15 LAB — VITAMIN D 25 HYDROXY (VIT D DEFICIENCY, FRACTURES): VITD: 32.96 ng/mL (ref 30.00–100.00)

## 2021-09-15 LAB — VITAMIN B12: Vitamin B-12: 647 pg/mL (ref 211–911)

## 2021-09-15 LAB — PSA: PSA: 1.13 ng/mL (ref 0.10–4.00)

## 2021-09-15 LAB — TSH: TSH: 4.05 u[IU]/mL (ref 0.35–5.50)

## 2021-09-15 MED ORDER — LEVOTHYROXINE SODIUM 25 MCG PO TABS
25.0000 ug | ORAL_TABLET | Freq: Every day | ORAL | 3 refills | Status: DC
Start: 2021-09-15 — End: 2022-09-18

## 2021-09-15 MED ORDER — AMLODIPINE BESYLATE 10 MG PO TABS: ORAL_TABLET | ORAL | 3 refills | Status: DC

## 2021-09-15 MED ORDER — SILDENAFIL CITRATE 100 MG PO TABS: 100.0000 mg | ORAL_TABLET | Freq: Every day | ORAL | 11 refills | Status: DC | PRN

## 2021-09-15 NOTE — Assessment & Plan Note (Signed)
Pt counsled to quit, pt not ready °

## 2021-09-15 NOTE — Patient Instructions (Addendum)
You will be contacted regarding the referral for: cologuard  Please continue all other medications as before, and refills have been done if requested.  Please have the pharmacy call with any other refills you may need.  Please continue your efforts at being more active, low cholesterol diet, and weight control.  You are otherwise up to date with prevention measures today.  Please keep your appointments with your specialists as you may have planned  Please go to the LAB at the blood drawing area for the tests to be done  You will be contacted by phone if any changes need to be made immediately.  Otherwise, you will receive a letter about your results with an explanation, but please check with MyChart first.  Please remember to sign up for MyChart if you have not done so, as this will be important to you in the future with finding out test results, communicating by private email, and scheduling acute appointments online when needed.  Please make an Appointment to return for your 1 year visit, or sooner if needed, with Lab testing by Appointment as well, to be done about 3-5 days before at the [facility] (so this is for TWO appointments - please see the scheduling desk as you leave

## 2021-09-15 NOTE — Progress Notes (Signed)
Patient ID: Frederick Zaldivar., male   DOB: June 01, 1955, 66 y.o.   MRN: [id number]         Chief Complaint:: wellness exam and hld, htn, smoker       HPI:  Frederick Ramone. is a 66 y.o. male here for wellness exam; declines colonoscopy, o/w up to date                        Also BP at home < 140/90, does not want BP med further, declines statin as well.  Still smoking, not ready to quit.  Pt denies chest pain, increased sob or doe, wheezing, orthopnea, PND, increased LE swelling, palpitations, dizziness or syncope.   Pt denies polydipsia, polyuria, or new focal neuro s/s.    Pt denies fever, wt loss, night sweats, loss of appetite, or other constitutional symptoms     Wt Readings from Last 3 Encounters:  09/15/21 155 lb 6.4 oz (70.5 kg)  09/10/20 155 lb 12.8 oz (70.7 kg)  08/21/19 153 lb (69.4 kg)   BP Readings from Last 3 Encounters:  09/15/21 (!) 172/90  09/10/20 122/70  08/21/19 140/70   Immunization History  Administered Date(s) Administered   Influenza Whole 01/03/2008   Influenza,inj,Quad PF,6+ Mos 11/14/2017   Influenza-Unspecified 12/21/2017, 12/02/2020   PFIZER(Purple Top)SARS-COV-2 Vaccination 05/22/2019, 06/09/2019, 11/24/2019   Pneumococcal Conjugate-13 02/20/2018   Pneumococcal Polysaccharide-23 12/11/2017   Td 12/27/2005   Tdap 03/25/2015  There are no preventive care reminders to display for this patient.    Past Medical History:  Diagnosis Date   Aortic valve regurgitation 11/03/2011   Mild, with normal EF Nov 2007 echo   CHEST PAIN, PLEURITIC 01/16/2007   Cough 11/07/2007   Emphysema 11/03/2011   Diffuse changes by CT chest 2005   Erectile dysfunction 11/03/2011   Eustachian tube dysfunction    Bilateral ear tubs for Eustachian Valve Dysfunction   History of scarlet fever    HYPERCHOLESTEROLEMIA 01/16/2007   Hyperglycemia    HYPERTENSION 09/14/2006   Hypothyroidism 11/03/2011   Raynaud's phenomenon    SMOKER 10/06/2008   THYROID NODULE, LEFT 11/07/2007    Past Surgical History:  Procedure Laterality Date   [place]   THYROID LOBECTOMY  03/2005   left    reports that he has quit smoking. His smoking use included cigars. He has never used smokeless tobacco. He reports current alcohol use. He reports that he does not use drugs. family history includes Hypertension in his father. Allergies  Allergen Reactions   Ace Inhibitors    Statins Other (See Comments)    Refusesdue to Family with problems with statins   Current Outpatient Medications on File Prior to Visit  Medication Sig Dispense Refill   aspirin 81 MG EC tablet Take 1 tablet (81 mg total) by mouth daily. Swallow whole. 30 tablet 12   fish oil-omega-3 fatty acids 1000 MG capsule Take 1 capsule by mouth daily.       Multiple Vitamin (MULTIVITAMIN) tablet Take 1 tablet by mouth daily.       No current facility-administered medications on file prior to visit.        ROS:  All others reviewed and negative.  Objective        PE:  BP (!) 172/90 (BP Location: Left Arm, Patient Position: Sitting, Cuff Size: Normal)   Pulse 72   Temp 97.7 F (36.5 C) (Oral)   Ht 6' (1.829 m)  Wt 155 lb 6.4 oz (70.5 kg)   SpO2 97%   BMI 21.08 kg/m                 Constitutional: Pt appears in NAD               HENT: Head: NCAT.                Right Ear: External ear normal.                 Left Ear: External ear normal.                Eyes: . Pupils are equal, round, and reactive to light. Conjunctivae and EOM are normal               Nose: without d/c or deformity               Neck: Neck supple. Gross normal ROM               Cardiovascular: Normal rate and regular rhythm.                 Pulmonary/Chest: Effort normal and breath sounds without rales or wheezing.                Abd:  Soft, NT, ND, + BS, no organomegaly               Neurological: Pt is alert. At baseline orientation, motor grossly intact               Skin: Skin is warm. No rashes, no other new lesions,  LE edema - none               Psychiatric: Pt behavior is normal without agitation   Micro: none  Cardiac tracings I have personally interpreted today:  none  Pertinent Radiological findings (summarize): none   Lab Results  Component Value Date   WBC 8.4 09/15/2021   HGB 16.3 09/15/2021   HCT 49.0 09/15/2021   PLT 225.0 09/15/2021   GLUCOSE 96 09/15/2021   CHOL 239 (H) 09/15/2021   TRIG 116.0 09/15/2021   HDL 46.90 09/15/2021   LDLDIRECT 183.1 11/15/2012   LDLCALC 169 (H) 09/15/2021   ALT 12 09/15/2021   AST 19 09/15/2021   NA 138 09/15/2021   K 4.3 09/15/2021   CL 101 09/15/2021   CREATININE 0.92 09/15/2021   BUN 14 09/15/2021   CO2 30 09/15/2021   TSH 4.05 09/15/2021   PSA 1.13 09/15/2021   HGBA1C 5.7 03/27/2006   Assessment/Plan:  Frederick Filippini. is a 66 y.o. White or Caucasian [1] male with  has a past medical history of Aortic valve regurgitation (11/03/2011), CHEST PAIN, PLEURITIC (01/16/2007), Cough (11/07/2007), Emphysema (11/03/2011), Erectile dysfunction (11/03/2011), Eustachian tube dysfunction, History of scarlet fever, HYPERCHOLESTEROLEMIA (01/16/2007), Hyperglycemia, HYPERTENSION (09/14/2006), Hypothyroidism (11/03/2011), Raynaud's phenomenon, SMOKER (10/06/2008), and THYROID NODULE, LEFT (11/07/2007).  Smoker Pt counsled to quit, pt not ready  Encounter for well adult exam with abnormal findings Age and sex appropriate education and counseling updated with regular exercise and diet Referrals for preventative services - declines colonoscopy but ok for cologuard Immunizations addressed - none needed Smoking counseling  - none needed Evidence for depression or other mood disorder - none significant Most recent labs reviewed. I have personally reviewed and have noted: 1) the patient's medical and social history 2) The patient's current medications and supplements 3) The patient's height, weight, and  BMI have been recorded in the  chart   HYPERCHOLESTEROLEMIA Lab Results  Component Value Date   LDLCALC 169 (H) 09/15/2021   Uncontrolled, goal ldl < 100,, pt to continue current lower chol diet, declines statin   Essential hypertension BP Readings from Last 3 Encounters:  09/15/21 (!) 172/90  09/10/20 122/70  08/21/19 140/70   Uncontrolled,, pt to restart norvasc 10 qd   Hypothyroidism Lab Results  Component Value Date   TSH 4.05 09/15/2021   Stable, pt to continue levothyroxine 25 mcg qd Followup: Return in about 1 year (around 09/16/2022).  [name], MD 09/17/2021 4:04 PM [facility]

## 2021-09-17 ENCOUNTER — Encounter: Payer: Self-pay | Admitting: Internal Medicine

## 2021-09-17 NOTE — Assessment & Plan Note (Signed)
Lab Results  Component Value Date   TSH 4.05 09/15/2021   Stable, pt to continue levothyroxine 25 mcg qd

## 2021-09-17 NOTE — Assessment & Plan Note (Signed)
Lab Results  Component Value Date   LDLCALC 169 (H) 09/15/2021   Uncontrolled, goal ldl < 100,, pt to continue current lower chol diet, declines statin

## 2021-09-17 NOTE — Assessment & Plan Note (Signed)
BP Readings from Last 3 Encounters:  09/15/21 (!) 172/90  09/10/20 122/70  08/21/19 140/70   Uncontrolled,, pt to restart norvasc 10 qd

## 2021-09-17 NOTE — Assessment & Plan Note (Addendum)
Age and sex appropriate education and counseling updated with regular exercise and diet Referrals for preventative services - declines colonoscopy but ok for cologuard Immunizations addressed - none needed Smoking counseling  - none needed Evidence for depression or other mood disorder - none significant Most recent labs reviewed. I have personally reviewed and have noted: 1) the patient's medical and social history 2) The patient's current medications and supplements 3) The patient's height, weight, and BMI have been recorded in the chart

## 2021-09-26 DIAGNOSIS — Z1212 Encounter for screening for malignant neoplasm of rectum: Secondary | ICD-10-CM | POA: Diagnosis not present

## 2021-09-26 DIAGNOSIS — Z1211 Encounter for screening for malignant neoplasm of colon: Secondary | ICD-10-CM | POA: Diagnosis not present

## 2021-10-04 ENCOUNTER — Other Ambulatory Visit: Payer: Self-pay | Admitting: Internal Medicine

## 2021-10-04 DIAGNOSIS — R195 Other fecal abnormalities: Secondary | ICD-10-CM

## 2021-10-04 LAB — COLOGUARD: COLOGUARD: POSITIVE — AB

## 2021-10-05 ENCOUNTER — Telehealth: Payer: Self-pay | Admitting: Internal Medicine

## 2021-10-05 NOTE — Telephone Encounter (Signed)
Pt is requesting a call with lab results from 09/26/21 cologuard.  Please advise.

## 2021-10-07 NOTE — Telephone Encounter (Signed)
Patient wants someone to call him back about cologuard results - please call patient

## 2021-10-07 NOTE — Telephone Encounter (Signed)
Spoke with patient and informed him of results and a referral for colonoscopy.He will receive a copy of his results through the mail.

## 2021-11-30 ENCOUNTER — Ambulatory Visit: Payer: Medicare Other | Admitting: Internal Medicine

## 2021-11-30 ENCOUNTER — Encounter: Payer: Self-pay | Admitting: Internal Medicine

## 2021-11-30 VITALS — BP 136/62 | HR 76 | Temp 97.9°F | Ht 72.0 in | Wt 159.0 lb

## 2021-11-30 DIAGNOSIS — J019 Acute sinusitis, unspecified: Secondary | ICD-10-CM | POA: Insufficient documentation

## 2021-11-30 DIAGNOSIS — J309 Allergic rhinitis, unspecified: Secondary | ICD-10-CM

## 2021-11-30 DIAGNOSIS — I10 Essential (primary) hypertension: Secondary | ICD-10-CM

## 2021-11-30 DIAGNOSIS — F17200 Nicotine dependence, unspecified, uncomplicated: Secondary | ICD-10-CM

## 2021-11-30 DIAGNOSIS — R195 Other fecal abnormalities: Secondary | ICD-10-CM | POA: Diagnosis not present

## 2021-11-30 MED ORDER — DOXYCYCLINE HYCLATE 100 MG PO TABS: 100.0000 mg | ORAL_TABLET | Freq: Two times a day (BID) | ORAL | 0 refills | Status: DC

## 2021-11-30 MED ORDER — HYDROCODONE BIT-HOMATROP MBR 5-1.5 MG/5ML PO SOLN: 5.0000 mL | Freq: Four times a day (QID) | ORAL | 0 refills | Status: AC | PRN

## 2021-11-30 MED ORDER — PREDNISONE 10 MG PO TABS: ORAL_TABLET | ORAL | 0 refills | Status: DC

## 2021-11-30 MED ORDER — METHYLPREDNISOLONE ACETATE 80 MG/ML IJ SUSP
80.0000 mg | Freq: Once | INTRAMUSCULAR | Status: AC
  Administered 2021-11-30: 80 mg via INTRAMUSCULAR

## 2021-11-30 NOTE — Progress Notes (Signed)
Patient ID: Frederick Scott., male   DOB: 12-Oct-1955, 66 y.o.   MRN: [id number]        Chief Complaint: follow up with sinus pain       HPI:  Frederick Scott. is a 67 y.o. male here  Here with 2 wks acute onset fever, facial pain, pressure, headache, general weakness and malaise, and greenish d/c, with mild ST and cough, but pt denies chest pain, wheezing, increased sob or doe, orthopnea, PND, increased LE swelling, palpitations, dizziness or syncope.  Does have several wks ongoing nasal allergy symptoms with clearish congestion, itch and sneezing, without fever, pain, ST, cough, swelling or wheezing.    Pt denies wt loss, night sweats, or other constitutional symptoms but has less appetite.  Pt did home test neg for covid yetserday       Wt Readings from Last 3 Encounters:  11/30/21 159 lb (72.1 kg)  09/15/21 155 lb 6.4 oz (70.5 kg)  09/10/20 155 lb 12.8 oz (70.7 kg)   BP Readings from Last 3 Encounters:  11/30/21 136/62  09/15/21 (!) 172/90  09/10/20 122/70         Past Medical History:  Diagnosis Date   Aortic valve regurgitation 11/03/2011   Mild, with normal EF Nov 2007 echo   CHEST PAIN, PLEURITIC 01/16/2007   Cough 11/07/2007   Emphysema 11/03/2011   Diffuse changes by CT chest 2005   Erectile dysfunction 11/03/2011   Eustachian tube dysfunction    Bilateral ear tubs for Eustachian Valve Dysfunction   History of scarlet fever    HYPERCHOLESTEROLEMIA 01/16/2007   Hyperglycemia    HYPERTENSION 09/14/2006   Hypothyroidism 11/03/2011   Raynaud's phenomenon    SMOKER 10/06/2008   THYROID NODULE, LEFT 11/07/2007   Past Surgical History:  Procedure Laterality Date   [place]   THYROID LOBECTOMY  03/2005   left    reports that he has quit smoking. His smoking use included cigars. He has never used smokeless tobacco. He reports current alcohol use. He reports that he does not use drugs. family history includes Hypertension in his father. Allergies  Allergen  Reactions   Ace Inhibitors    Statins Other (See Comments)    Refusesdue to Family with problems with statins   Current Outpatient Medications on File Prior to Visit  Medication Sig Dispense Refill   amLODipine (NORVASC) 10 MG tablet TAKE ONE TABLET BY MOUTH DAILY 90 tablet 3   aspirin 81 MG EC tablet Take 1 tablet (81 mg total) by mouth daily. Swallow whole. 30 tablet 12   fish oil-omega-3 fatty acids 1000 MG capsule Take 1 capsule by mouth daily.       levothyroxine (SYNTHROID) 25 MCG tablet Take 1 tablet (25 mcg total) by mouth daily before breakfast. 90 tablet 3   Multiple Vitamin (MULTIVITAMIN) tablet Take 1 tablet by mouth daily.       sildenafil (VIAGRA) 100 MG tablet Take 1 tablet (100 mg total) by mouth daily as needed. 12 tablet 11   No current facility-administered medications on file prior to visit.        ROS:  All others reviewed and negative.  Objective        PE:  BP 136/62 (BP Location: Right Arm, Patient Position: Sitting, Cuff Size: Normal)   Pulse 76   Temp 97.9 F (36.6 C) (Oral)   Ht 6' (1.829 m)   Wt 159 lb (72.1 kg)   SpO2 94%  BMI 21.56 kg/m                 Constitutional: Pt appears in NAD, mild ill               HENT: Head: NCAT.                Right Ear: External ear normal.                 Left Ear: External ear normal.  Bilat tm's with mild erythema.  Max sinus areas mild tender.  Pharynx with mild erythema, no exudate               Eyes: . Pupils are equal, round, and reactive to light. Conjunctivae and EOM are normal               Nose: without d/c or deformity               Neck: Neck supple. Gross normal ROM               Cardiovascular: Normal rate and regular rhythm.                 Pulmonary/Chest: Effort normal and breath sounds without rales or wheezing.                               Neurological: Pt is alert. At baseline orientation, motor grossly intact               Skin: Skin is warm. No rashes, no other new lesions, LE edema -  none               Psychiatric: Pt behavior is normal without agitation   Micro: none  Cardiac tracings I have personally interpreted today:  none  Pertinent Radiological findings (summarize): none   Lab Results  Component Value Date   WBC 8.4 09/15/2021   HGB 16.3 09/15/2021   HCT 49.0 09/15/2021   PLT 225.0 09/15/2021   GLUCOSE 96 09/15/2021   CHOL 239 (H) 09/15/2021   TRIG 116.0 09/15/2021   HDL 46.90 09/15/2021   LDLDIRECT 183.1 11/15/2012   LDLCALC 169 (H) 09/15/2021   ALT 12 09/15/2021   AST 19 09/15/2021   NA 138 09/15/2021   K 4.3 09/15/2021   CL 101 09/15/2021   CREATININE 0.92 09/15/2021   BUN 14 09/15/2021   CO2 30 09/15/2021   TSH 4.05 09/15/2021   PSA 1.13 09/15/2021   HGBA1C 5.7 03/27/2006   Assessment/Plan:  Frederick Scott. is a 65 y.o. White or Caucasian [1] male with  has a past medical history of Aortic valve regurgitation (11/03/2011), CHEST PAIN, PLEURITIC (01/16/2007), Cough (11/07/2007), Emphysema (11/03/2011), Erectile dysfunction (11/03/2011), Eustachian tube dysfunction, History of scarlet fever, HYPERCHOLESTEROLEMIA (01/16/2007), Hyperglycemia, HYPERTENSION (09/14/2006), Hypothyroidism (11/03/2011), Raynaud's phenomenon, SMOKER (10/06/2008), and THYROID NODULE, LEFT (11/07/2007).  Smoker Pt counsled to quit, pt not ready  Essential hypertension BP Readings from Last 3 Encounters:  11/30/21 136/62  09/15/21 (!) 172/90  09/10/20 122/70   Stable, pt to continue medical treatment norvasc 10 mg qd   Acute sinus infection Mild to mod, for doxycycline course,  to f/u any worsening symptoms or concerns  Allergic rhinitis Mild to mod, for depomedrol I'm 80 mg, prednisone taper, cough med prn,  to f/u any worsening symptoms or concerns  Followup: Return if symptoms worsen or fail to improve.  [name]  [name], MD 11/30/2021 7:22 PM [facility]

## 2021-11-30 NOTE — Assessment & Plan Note (Signed)
Mild to mod, for depomedrol I'm 80 mg, prednisone taper, cough med prn,  to f/u any worsening symptoms or concerns

## 2021-11-30 NOTE — Assessment & Plan Note (Signed)
BP Readings from Last 3 Encounters:  11/30/21 136/62  09/15/21 (!) 172/90  09/10/20 122/70   Stable, pt to continue medical treatment norvasc 10 mg qd

## 2021-11-30 NOTE — Patient Instructions (Signed)
Please take all new medication as prescribed - the antibiotic, cough medicine, and prednisone as prescribed  You had the steroid shot today  Please stop smoking  Please continue all other medications as before, and refills have been done if requested.  Please have the pharmacy call with any other refills you may need.  Please keep your appointments with your specialists as you may have planned

## 2021-11-30 NOTE — Assessment & Plan Note (Signed)
Mild to mod, for doxycycline course,  to f/u any worsening symptoms or concerns

## 2021-11-30 NOTE — Assessment & Plan Note (Signed)
Pt counsled to quit, pt not ready °

## 2021-12-22 DIAGNOSIS — D125 Benign neoplasm of sigmoid colon: Secondary | ICD-10-CM | POA: Diagnosis not present

## 2021-12-22 DIAGNOSIS — D122 Benign neoplasm of ascending colon: Secondary | ICD-10-CM | POA: Diagnosis not present

## 2021-12-22 DIAGNOSIS — K635 Polyp of colon: Secondary | ICD-10-CM | POA: Diagnosis not present

## 2021-12-22 DIAGNOSIS — Z1211 Encounter for screening for malignant neoplasm of colon: Secondary | ICD-10-CM | POA: Diagnosis not present

## 2021-12-22 DIAGNOSIS — R195 Other fecal abnormalities: Secondary | ICD-10-CM | POA: Diagnosis not present

## 2021-12-22 DIAGNOSIS — K5730 Diverticulosis of large intestine without perforation or abscess without bleeding: Secondary | ICD-10-CM | POA: Diagnosis not present

## 2021-12-22 LAB — HM COLONOSCOPY

## 2022-04-17 DIAGNOSIS — R0989 Other specified symptoms and signs involving the circulatory and respiratory systems: Secondary | ICD-10-CM | POA: Diagnosis not present

## 2022-04-17 DIAGNOSIS — L600 Ingrowing nail: Secondary | ICD-10-CM | POA: Diagnosis not present

## 2022-04-25 DIAGNOSIS — B078 Other viral warts: Secondary | ICD-10-CM | POA: Diagnosis not present

## 2022-04-25 DIAGNOSIS — D485 Neoplasm of uncertain behavior of skin: Secondary | ICD-10-CM | POA: Diagnosis not present

## 2022-04-25 DIAGNOSIS — L570 Actinic keratosis: Secondary | ICD-10-CM | POA: Diagnosis not present

## 2022-04-25 DIAGNOSIS — C44229 Squamous cell carcinoma of skin of left ear and external auricular canal: Secondary | ICD-10-CM | POA: Diagnosis not present

## 2022-05-18 DIAGNOSIS — C44229 Squamous cell carcinoma of skin of left ear and external auricular canal: Secondary | ICD-10-CM | POA: Diagnosis not present

## 2022-06-05 ENCOUNTER — Other Ambulatory Visit: Payer: Self-pay | Admitting: Internal Medicine

## 2022-06-20 DIAGNOSIS — Z5189 Encounter for other specified aftercare: Secondary | ICD-10-CM | POA: Diagnosis not present

## 2022-08-29 ENCOUNTER — Telehealth: Payer: Self-pay | Admitting: Internal Medicine

## 2022-08-29 NOTE — Telephone Encounter (Signed)
Refill too soon

## 2022-08-29 NOTE — Telephone Encounter (Signed)
Prescription Request  08/29/2022  LOV: 11/30/2021  What is the name of the medication or equipment? amLODipine (NORVASC) 10 MG tablet  levothyroxine (SYNTHROID) 25 MCG tablet [  Have you contacted your pharmacy to request a refill? No   Which pharmacy would you like this sent to?  [facility] [address], [place] [address] [phone] Patient notified that their request is being sent to the clinical staff for review and that they should receive a response within 2 business days.   Please advise at Mobile [phone] (mobile)

## 2022-08-29 NOTE — Telephone Encounter (Signed)
Pt wants to change his Pharmacy to [facility] [address] [place], [place] [address] [phone]

## 2022-09-05 ENCOUNTER — Other Ambulatory Visit: Payer: Self-pay | Admitting: Internal Medicine

## 2022-09-08 NOTE — Telephone Encounter (Signed)
Pt called stating he is down on these 2 medication wanting to know is they ready to be refilled. Please advise.

## 2022-09-12 NOTE — Telephone Encounter (Signed)
Pt called back asking why he haven't had his Rx refilled yet. Pt been out since Sunday  amLODipine (NORVASC) 10 MG tablet  levothyroxine (SYNTHROID) 25 MCG tablet Please advise.

## 2022-09-13 DIAGNOSIS — D485 Neoplasm of uncertain behavior of skin: Secondary | ICD-10-CM | POA: Diagnosis not present

## 2022-09-13 DIAGNOSIS — L720 Epidermal cyst: Secondary | ICD-10-CM | POA: Diagnosis not present

## 2022-09-18 ENCOUNTER — Encounter: Payer: Self-pay | Admitting: Internal Medicine

## 2022-09-18 ENCOUNTER — Ambulatory Visit: Payer: Medicare Other | Admitting: Internal Medicine

## 2022-09-18 VITALS — BP 136/82 | HR 85 | Temp 97.7°F | Ht 72.0 in | Wt 154.0 lb

## 2022-09-18 DIAGNOSIS — I10 Essential (primary) hypertension: Secondary | ICD-10-CM | POA: Diagnosis not present

## 2022-09-18 DIAGNOSIS — E559 Vitamin D deficiency, unspecified: Secondary | ICD-10-CM

## 2022-09-18 DIAGNOSIS — I739 Peripheral vascular disease, unspecified: Secondary | ICD-10-CM

## 2022-09-18 DIAGNOSIS — F17200 Nicotine dependence, unspecified, uncomplicated: Secondary | ICD-10-CM | POA: Diagnosis not present

## 2022-09-18 DIAGNOSIS — E538 Deficiency of other specified B group vitamins: Secondary | ICD-10-CM | POA: Diagnosis not present

## 2022-09-18 DIAGNOSIS — R739 Hyperglycemia, unspecified: Secondary | ICD-10-CM | POA: Diagnosis not present

## 2022-09-18 DIAGNOSIS — E7800 Pure hypercholesterolemia, unspecified: Secondary | ICD-10-CM | POA: Diagnosis not present

## 2022-09-18 DIAGNOSIS — H61102 Unspecified noninfective disorders of pinna, left ear: Secondary | ICD-10-CM | POA: Diagnosis not present

## 2022-09-18 DIAGNOSIS — E039 Hypothyroidism, unspecified: Secondary | ICD-10-CM | POA: Diagnosis not present

## 2022-09-18 DIAGNOSIS — Z125 Encounter for screening for malignant neoplasm of prostate: Secondary | ICD-10-CM

## 2022-09-18 DIAGNOSIS — Z0001 Encounter for general adult medical examination with abnormal findings: Secondary | ICD-10-CM

## 2022-09-18 DIAGNOSIS — J439 Emphysema, unspecified: Secondary | ICD-10-CM

## 2022-09-18 LAB — BASIC METABOLIC PANEL
BUN: 15 mg/dL (ref 6–23)
CO2: 28 mEq/L (ref 19–32)
Calcium: 9.7 mg/dL (ref 8.4–10.5)
Chloride: 103 mEq/L (ref 96–112)
Creatinine, Ser: 0.93 mg/dL (ref 0.40–1.50)
GFR: 85.24 mL/min (ref >60.00)
Glucose, Bld: 104 mg/dL — ABNORMAL HIGH (ref 70–99)
Potassium: 4.6 mEq/L (ref 3.5–5.1)
Sodium: 139 mEq/L (ref 135–145)

## 2022-09-18 LAB — URINALYSIS, ROUTINE W REFLEX MICROSCOPIC
Bilirubin Urine: NEGATIVE
Ketones, ur: NEGATIVE
Nitrite: NEGATIVE
Specific Gravity, Urine: 1.01 (ref 1.000–1.030)
Total Protein, Urine: NEGATIVE
Urine Glucose: NEGATIVE
Urobilinogen, UA: 0.2 (ref 0.0–1.0)
pH: 6 (ref 5.0–8.0)

## 2022-09-18 LAB — LIPID PANEL
Cholesterol: 224 mg/dL — ABNORMAL HIGH (ref 0–200)
HDL: 50.3 mg/dL (ref >39.00)
LDL Cholesterol: 153 mg/dL — ABNORMAL HIGH (ref 0–99)
NonHDL: 173.39
Total CHOL/HDL Ratio: 4
Triglycerides: 104 mg/dL (ref 0.0–149.0)
VLDL: 20.8 mg/dL (ref 0.0–40.0)

## 2022-09-18 LAB — VITAMIN D 25 HYDROXY (VIT D DEFICIENCY, FRACTURES): VITD: 43.03 ng/mL (ref 30.00–100.00)

## 2022-09-18 LAB — MICROALBUMIN / CREATININE URINE RATIO
Creatinine,U: 45 mg/dL
Microalb Creat Ratio: 1.6 mg/g (ref 0.0–30.0)
Microalb, Ur: <0.7 mg/dL (ref 0.0–1.9)

## 2022-09-18 LAB — CBC WITH DIFFERENTIAL/PLATELET
Basophils Absolute: 0.1 10*3/uL (ref 0.0–0.1)
Basophils Relative: 0.7 % (ref 0.0–3.0)
Eosinophils Absolute: 0.1 10*3/uL (ref 0.0–0.7)
Eosinophils Relative: 1.3 % (ref 0.0–5.0)
HCT: 50.8 % (ref 39.0–52.0)
Hemoglobin: 16.4 g/dL (ref 13.0–17.0)
Lymphocytes Relative: 27.7 % (ref 12.0–46.0)
Lymphs Abs: 2.3 10*3/uL (ref 0.7–4.0)
MCHC: 32.2 g/dL (ref 30.0–36.0)
MCV: 98.1 fl (ref 78.0–100.0)
Monocytes Absolute: 0.7 10*3/uL (ref 0.1–1.0)
Monocytes Relative: 8.3 % (ref 3.0–12.0)
Neutro Abs: 5.2 10*3/uL (ref 1.4–7.7)
Neutrophils Relative %: 62 % (ref 43.0–77.0)
Platelets: 242 10*3/uL (ref 150.0–400.0)
RBC: 5.18 Mil/uL (ref 4.22–5.81)
RDW: 14.1 % (ref 11.5–15.5)
WBC: 8.4 10*3/uL (ref 4.0–10.5)

## 2022-09-18 LAB — HEPATIC FUNCTION PANEL
ALT: 14 U/L (ref 0–53)
AST: 20 U/L (ref 0–37)
Albumin: 4.5 g/dL (ref 3.5–5.2)
Alkaline Phosphatase: 96 U/L (ref 39–117)
Bilirubin, Direct: 0.1 mg/dL (ref 0.0–0.3)
Total Bilirubin: 0.5 mg/dL (ref 0.2–1.2)
Total Protein: 7.3 g/dL (ref 6.0–8.3)

## 2022-09-18 LAB — TSH: TSH: 4.94 u[IU]/mL (ref 0.35–5.50)

## 2022-09-18 LAB — HEMOGLOBIN A1C: Hgb A1c MFr Bld: 5.6 % (ref 4.6–6.5)

## 2022-09-18 LAB — PSA: PSA: 0.65 ng/mL (ref 0.10–4.00)

## 2022-09-18 LAB — VITAMIN B12: Vitamin B-12: 550 pg/mL (ref 211–911)

## 2022-09-18 MED ORDER — AMLODIPINE BESYLATE 10 MG PO TABS: ORAL_TABLET | ORAL | 3 refills | Status: DC

## 2022-09-18 MED ORDER — SILDENAFIL CITRATE 100 MG PO TABS: 100.0000 mg | ORAL_TABLET | Freq: Every day | ORAL | 11 refills | Status: AC | PRN

## 2022-09-18 MED ORDER — LEVOTHYROXINE SODIUM 25 MCG PO TABS: 25.0000 ug | ORAL_TABLET | Freq: Every day | ORAL | 3 refills | Status: DC

## 2022-09-18 MED ORDER — SILDENAFIL CITRATE 100 MG PO TABS: 100.0000 mg | ORAL_TABLET | Freq: Every day | ORAL | 11 refills | Status: DC | PRN

## 2022-09-18 NOTE — Progress Notes (Unsigned)
Patient ID: Frederick Scott., male   DOB: Jul 19, 1955, 67 y.o.   MRN: [id number]         Chief Complaint:: wellness exam and left leg pain, left ear lesion, hld, htn, copd, low thyroid, smoker       HPI:  Frederick Scott. is a 67 y.o. male here for wellness exam; up to date.  Still smoking, not ready to quit. Did have recent colonoscopy nov 2023 with 9 polyps, for f/u at 1 yr.                  Also has run out of meds for one wk, eager to restart.  Pt denies chest pain, increased sob or doe, wheezing, orthopnea, PND, increased LE swelling, palpitations, dizziness or syncope.   Pt denies polydipsia, polyuria, or new focal neuro s/s.    Pt denies fever, wt loss, night sweats, loss of appetite, or other constitutional symptoms  Does have left leg distal pain with ambulation, seems to recur about 50 yds each time, better with rest.  Pt had left pinna lesion removed recently, awaiting pathology   Wt Readings from Last 3 Encounters:  09/18/22 154 lb (69.9 kg)  11/30/21 159 lb (72.1 kg)  09/15/21 155 lb 6.4 oz (70.5 kg)   BP Readings from Last 3 Encounters:  09/18/22 136/82  11/30/21 136/62  09/15/21 (!) 172/90   Immunization History  Administered Date(s) Administered   Influenza Whole 01/03/2008   Influenza,inj,Quad PF,6+ Mos 11/14/2017   Influenza-Unspecified 12/21/2017, 12/02/2020   PFIZER(Purple Top)SARS-COV-2 Vaccination 05/22/2019, 06/09/2019, 11/24/2019   Pneumococcal Conjugate-13 02/20/2018   Pneumococcal Polysaccharide-23 12/11/2017   Td 12/27/2005   Tdap 03/25/2015   Health Maintenance Due  Topic Date Due   Medicare Annual Wellness (AWV)  09/16/2022   Pneumonia Vaccine 35+ Years old (3 of 3 - PPSV23 or PCV20) 12/12/2022      Past Medical History:  Diagnosis Date   Aortic valve regurgitation 11/03/2011   Mild, with normal EF Nov 2007 echo   CHEST PAIN, PLEURITIC 01/16/2007   Cough 11/07/2007   Emphysema 11/03/2011   Diffuse changes by CT chest 2005   Erectile  dysfunction 11/03/2011   Eustachian tube dysfunction    Bilateral ear tubs for Eustachian Valve Dysfunction   History of scarlet fever    HYPERCHOLESTEROLEMIA 01/16/2007   Hyperglycemia    HYPERTENSION 09/14/2006   Hypothyroidism 11/03/2011   Raynaud's phenomenon    SMOKER 10/06/2008   THYROID NODULE, LEFT 11/07/2007   Past Surgical History:  Procedure Laterality Date   NASAL SEPTUM SURGERY  1980   THYROID LOBECTOMY  03/2005   left    reports that he has quit smoking. His smoking use included cigars. He has never used smokeless tobacco. He reports current alcohol use. He reports that he does not use drugs. family history includes Hypertension in his father. Allergies  Allergen Reactions   Ace Inhibitors    Statins Other (See Comments)    Refusesdue to Family with problems with statins   Current Outpatient Medications on File Prior to Visit  Medication Sig Dispense Refill   aspirin 81 MG EC tablet Take 1 tablet (81 mg total) by mouth daily. Swallow whole. 30 tablet 12   fish oil-omega-3 fatty acids 1000 MG capsule Take 1 capsule by mouth daily.       Multiple Vitamin (MULTIVITAMIN) tablet Take 1 tablet by mouth daily.       No current facility-administered medications on file prior to visit.  ROS:  All others reviewed and negative.  Objective        PE:  BP 136/82 (BP Location: Left Arm, Patient Position: Sitting, Cuff Size: Normal)   Pulse 85   Temp 97.7 F (36.5 C) (Oral)   Ht 6' (1.829 m)   Wt 154 lb (69.9 kg)   SpO2 97%   BMI 20.89 kg/m                 Constitutional: Pt appears in NAD               HENT: Head: NCAT.                Right Ear: External ear normal.                 Left Ear: External ear normal.                Eyes: . Pupils are equal, round, and reactive to light. Conjunctivae and EOM are normal               Nose: without d/c or deformity               Neck: Neck supple. Gross normal ROM               Cardiovascular: Normal rate and regular  rhythm.                 Pulmonary/Chest: Effort normal and breath sounds without rales or wheezing.                Abd:  Soft, NT, ND, + BS, no organomegaly               Neurological: Pt is alert. At baseline orientation, motor grossly intact               Skin: Skin is warm. No rashes, no other new lesions, LE edema - none               Psychiatric: Pt behavior is normal without agitation   Micro: none  Cardiac tracings I have personally interpreted today:  none  Pertinent Radiological findings (summarize): none   Lab Results  Component Value Date   WBC 8.4 09/18/2022   HGB 16.4 09/18/2022   HCT 50.8 09/18/2022   PLT 242.0 09/18/2022   GLUCOSE 104 (H) 09/18/2022   CHOL 224 (H) 09/18/2022   TRIG 104.0 09/18/2022   HDL 50.30 09/18/2022   LDLDIRECT 183.1 11/15/2012   LDLCALC 153 (H) 09/18/2022   ALT 14 09/18/2022   AST 20 09/18/2022   NA 139 09/18/2022   K 4.6 09/18/2022   CL 103 09/18/2022   CREATININE 0.93 09/18/2022   BUN 15 09/18/2022   CO2 28 09/18/2022   TSH 4.94 09/18/2022   PSA 0.65 09/18/2022   HGBA1C 5.6 09/18/2022   MICROALBUR <0.7 09/18/2022   Assessment/Plan:  Frederick Callow. is a 67 y.o. White or Caucasian [1] male with  has a past medical history of Aortic valve regurgitation (11/03/2011), CHEST PAIN, PLEURITIC (01/16/2007), Cough (11/07/2007), Emphysema (11/03/2011), Erectile dysfunction (11/03/2011), Eustachian tube dysfunction, History of scarlet fever, HYPERCHOLESTEROLEMIA (01/16/2007), Hyperglycemia, HYPERTENSION (09/14/2006), Hypothyroidism (11/03/2011), Raynaud's phenomenon, SMOKER (10/06/2008), and THYROID NODULE, LEFT (11/07/2007).  Lesion of left pinna S/p f/u [facility] last wk 7/24 per dr Loleta Chance - pt to f/u final pathology  Smoker Still smoking cigar occas - pt encouraged to stop  Encounter for well adult exam with abnormal findings  Age and sex appropriate education and counseling updated with regular exercise and diet Referrals for preventative  services - none needed Immunizations addressed - none needed Smoking counseling  - pt counsled to quit, pt not ready Evidence for depression or other mood disorder - none significant Most recent labs reviewed. I have personally reviewed and have noted: 1) the patient's medical and social history 2) The patient's current medications and supplements 3) The patient's height, weight, and BMI have been recorded in the chart   HYPERCHOLESTEROLEMIA Lab Results  Component Value Date   LDLCALC 153 (H) 09/18/2022   uncontrolled pt for lower chol diet, has been statin intolerant in past, declines repatha or zetia for now  Essential hypertension BP Readings from Last 3 Encounters:  09/18/22 136/82  11/30/21 136/62  09/15/21 (!) 172/90   Stable, pt to continue medical treatment norvasc 10 qd   Pulmonary emphysema (HCC) Stable overall, cont inhaler prn  Hypothyroidism Lab Results  Component Value Date   TSH 4.94 09/18/2022   Stable, pt to continue levothyroxine 25 mcg qd  PAD (peripheral artery disease) (HCC) Pt will need LE arterial study, r/o PAD  Followup: Return in about 6 months (around 03/21/2023).  Oliver Barre, MD 09/20/2022 8:06 PM [facility]

## 2022-09-18 NOTE — Patient Instructions (Addendum)
Please continue all other medications as before, and refills have been done if requested.  Please have the pharmacy call with any other refills you may need.  Please continue your efforts at being more active, low cholesterol diet, and weight control.  You are otherwise up to date with prevention measures today.  Please keep your appointments with your specialists as you may have planned - dermatology for the pathology report of the lesion to the left ear you had off recently  Please stop smoking  You will be contacted regarding the referral for: left leg circulation testing  Please go to the LAB at the blood drawing area for the tests to be done  You will be contacted by phone if any changes need to be made immediately.  Otherwise, you will receive a letter about your results with an explanation, but please check with MyChart first.  Please remember to sign up for MyChart if you have not done so, as this will be important to you in the future with finding out test results, communicating by private email, and scheduling acute appointments online when needed.  Please make an Appointment to return in 6 months, or sooner if needed

## 2022-09-18 NOTE — Assessment & Plan Note (Signed)
S/p f/u [facility] last wk 7/24 per dr Loleta Chance - pt to f/u final pathology

## 2022-09-18 NOTE — Assessment & Plan Note (Signed)
Still smoking cigar occas - pt encouraged to stop

## 2022-09-20 ENCOUNTER — Encounter: Payer: Self-pay | Admitting: Internal Medicine

## 2022-09-20 DIAGNOSIS — I739 Peripheral vascular disease, unspecified: Secondary | ICD-10-CM | POA: Insufficient documentation

## 2022-09-20 NOTE — Assessment & Plan Note (Signed)
Stable overall, cont inhaler prn 

## 2022-09-20 NOTE — Assessment & Plan Note (Signed)
Pt will need LE arterial study, r/o PAD

## 2022-09-20 NOTE — Assessment & Plan Note (Addendum)
Age and sex appropriate education and counseling updated with regular exercise and diet Referrals for preventative services - none needed Immunizations addressed - none needed Smoking counseling  - pt counsled to quit, pt not ready Evidence for depression or other mood disorder - none significant Most recent labs reviewed. I have personally reviewed and have noted: 1) the patient's medical and social history 2) The patient's current medications and supplements 3) The patient's height, weight, and BMI have been recorded in the chart

## 2022-09-20 NOTE — Assessment & Plan Note (Signed)
BP Readings from Last 3 Encounters:  09/18/22 136/82  11/30/21 136/62  09/15/21 (!) 172/90   Stable, pt to continue medical treatment norvasc 10 qd

## 2022-09-20 NOTE — Assessment & Plan Note (Signed)
Lab Results  Component Value Date   TSH 4.94 09/18/2022   Stable, pt to continue levothyroxine 25 mcg qd

## 2022-09-20 NOTE — Assessment & Plan Note (Signed)
Lab Results  Component Value Date   LDLCALC 153 (H) 09/18/2022   uncontrolled pt for lower chol diet, has been statin intolerant in past, declines repatha or zetia for now

## 2022-12-05 ENCOUNTER — Ambulatory Visit (INDEPENDENT_AMBULATORY_CARE_PROVIDER_SITE_OTHER): Payer: Medicare Other

## 2022-12-05 VITALS — Ht 72.0 in | Wt 155.0 lb

## 2022-12-05 DIAGNOSIS — Z0000 Encounter for general adult medical examination without abnormal findings: Secondary | ICD-10-CM

## 2022-12-05 NOTE — Progress Notes (Signed)
Subjective:   Frederick Scott. is a 67 y.o. male who presents for Medicare Annual/Subsequent preventive examination.  Visit Complete: Virtual I connected with  Frederick Scott. on 12/05/22 by a audio enabled telemedicine application and verified that I am speaking with the correct person using two identifiers.  Patient Location: Home  Provider Location: Office/Clinic  I discussed the limitations of evaluation and management by telemedicine. The patient expressed understanding and agreed to proceed.  Vital Signs: Because this visit was a virtual/telehealth visit, some criteria may be missing or patient reported. Any vitals not documented were not able to be obtained and vitals that have been documented are patient reported.  Cardiac Risk Factors include: advanced age (>63men, >56 women);dyslipidemia;family history of premature cardiovascular disease;hypertension;male gender;smoking/ tobacco exposure;Other (see comment), Risk factor comments: cigars (1 per day)     Objective:    Today's Vitals   12/05/22 1334  Weight: 155 lb (70.3 kg)  Height: 6' (1.829 m)  PainSc: 0-No pain   Body mass index is 21.02 kg/m.     12/05/2022    1:36 PM  Advanced Directives  Does Patient Have a Medical Advance Directive? No  Would patient like information on creating a medical advance directive? No - Patient declined    Current Medications (verified) Outpatient Encounter Medications as of 12/05/2022  Medication Sig   amLODipine (NORVASC) 10 MG tablet TAKE ONE TABLET BY MOUTH DAILY   aspirin 81 MG EC tablet Take 1 tablet (81 mg total) by mouth daily. Swallow whole.   fish oil-omega-3 fatty acids 1000 MG capsule Take 1 capsule by mouth daily.     levothyroxine (SYNTHROID) 25 MCG tablet Take 1 tablet (25 mcg total) by mouth daily before breakfast.   Multiple Vitamin (MULTIVITAMIN) tablet Take 1 tablet by mouth daily.     sildenafil (VIAGRA) 100 MG tablet Take 1 tablet (100 mg total) by  mouth daily as needed.   No facility-administered encounter medications on file as of 12/05/2022.    Allergies (verified) Ace inhibitors and Statins   History: Past Medical History:  Diagnosis Date   Aortic valve regurgitation 11/03/2011   Mild, with normal EF Nov 2007 echo   CHEST PAIN, PLEURITIC 01/16/2007   Cough 11/07/2007   Emphysema 11/03/2011   Diffuse changes by CT chest 2005   Erectile dysfunction 11/03/2011   Eustachian tube dysfunction    Bilateral ear tubs for Eustachian Valve Dysfunction   History of scarlet fever    HYPERCHOLESTEROLEMIA 01/16/2007   Hyperglycemia    HYPERTENSION 09/14/2006   Hypothyroidism 11/03/2011   Raynaud's phenomenon    SMOKER 10/06/2008   THYROID NODULE, LEFT 11/07/2007   Past Surgical History:  Procedure Laterality Date   NASAL SEPTUM SURGERY  1980   THYROID LOBECTOMY  03/2005   left   Family History  Problem Relation Age of Onset   Cancer Neg Hx    Hypertension Father    Social History   Socioeconomic History   Marital status: Single    Spouse name: Not on file   Number of children: Not on file   Years of education: Not on file   Highest education level: Not on file  Occupational History   Occupation: Unemployed  Tobacco Use   Smoking status: Former    Types: Cigars   Smokeless tobacco: Never  Substance and Sexual Activity   Alcohol use: Yes    Comment: 1-4/weekend   Drug use: No   Sexual activity: Not on file  Other Topics Concern   Not on file  Social History Narrative   Divorced   Social Determinants of Health   Financial Resource Strain: Low Risk  (12/05/2022)   Overall Financial Resource Strain (CARDIA)    Difficulty of Paying Living Expenses: Not hard at all  Food Insecurity: No Food Insecurity (12/05/2022)   Hunger Vital Sign    Worried About Running Out of Food in the Last Year: Never true    Ran Out of Food in the Last Year: Never true  Transportation Needs: No Transportation Needs (12/05/2022)   PRAPARE  - Administrator, Civil Service (Medical): No    Lack of Transportation (Non-Medical): No  Physical Activity: Sufficiently Active (12/05/2022)   Exercise Vital Sign    Days of Exercise per Week: 5 days    Minutes of Exercise per Session: 60 min  Stress: No Stress Concern Present (12/05/2022)   [employer] of Occupational Health - Occupational Stress Questionnaire    Feeling of Stress : Not at all  Social Connections: Unknown (12/05/2022)   Social Connection and Isolation Panel [NHANES]    Frequency of Communication with Friends and Family: More than three times a week    Frequency of Social Gatherings with Friends and Family: More than three times a week    Attends Religious Services: Patient unable to answer    Active Member of Clubs or Organizations: Yes    Attends Banker Meetings: Patient unable to answer    Marital Status: Never married    Tobacco Counseling Counseling given: Not Answered   Clinical Intake:  Pre-visit preparation completed: Yes  Pain : No/denies pain Pain Score: 0-No pain     BMI - recorded: 21.02 Nutritional Status: BMI of 19-24  Normal Nutritional Risks: None Diabetes: No  How often do you need to have someone help you when you read instructions, pamphlets, or other written materials from your doctor or pharmacy?: 1 - Never What is the last grade level you completed in school?: HSG; 1 YR TECHICAL COLLEGE  Interpreter Needed?: No  Information entered by :: [name], LPN.   Activities of Daily Living    12/05/2022    1:39 PM  In your present state of health, do you have any difficulty performing the following activities:  Hearing? 0  Vision? 0  Difficulty concentrating or making decisions? 0  Walking or climbing stairs? 0  Dressing or bathing? 0  Doing errands, shopping? 0  Preparing Food and eating ? N  Using the Toilet? N  In the past six months, have you accidently leaked urine? N  Do you  have problems with loss of bowel control? N  Managing your Medications? N  Managing your Finances? N  Housekeeping or managing your Housekeeping? N    Patient Care Team: Corwin Levins, MD as PCP - General (Internal Medicine)  Indicate any recent Medical Services you may have received from other than [facility] providers in the past year (date may be approximate).     Assessment:   This is a routine wellness examination for [place].  Hearing/Vision screen Hearing Screening - Comments:: Patient denied any hearing difficulty.   No hearing aids.  Vision Screening - Comments:: Patient does wear corrective lenses/contacts.  Annual eye exam done by: Have an appt for November 2024    Goals Addressed   None   Depression Screen    12/05/2022    1:38 PM 09/18/2022   10:03 AM 09/15/2021  10:09 AM 09/15/2021    9:55 AM 09/10/2020   10:15 AM 09/10/2020    9:59 AM 08/21/2019   10:43 AM  PHQ 2/9 Scores  PHQ - 2 Score 0 0 1 0 0 0 0  PHQ- 9 Score 0   0       Fall Risk    12/05/2022    1:37 PM 09/18/2022   10:03 AM 09/15/2021   10:09 AM 09/15/2021    9:55 AM 09/10/2020   10:15 AM  Fall Risk   Falls in the past year? 0 0 0 0 0  Number falls in past yr: 0 0 0 0 0  Injury with Fall? 0 0 0 0 0  Risk for fall due to : No Fall Risks No Fall Risks     Follow up Falls prevention discussed Falls evaluation completed       MEDICARE RISK AT HOME: Medicare Risk at Home Any stairs in or around the home?: Yes If so, are there any without handrails?: No Home free of loose throw rugs in walkways, pet beds, electrical cords, etc?: Yes Adequate lighting in your home to reduce risk of falls?: Yes Life alert?: No Use of a cane, walker or w/c?: No Grab bars in the bathroom?: No Shower chair or bench in shower?: No Elevated toilet seat or a handicapped toilet?: No  TIMED UP AND GO:  Was the test performed?  No    Cognitive Function:    12/05/2022    1:40 PM  MMSE - Mini Mental State Exam  Not  completed: Unable to complete        12/05/2022    1:40 PM  6CIT Screen  What Year? 0 points  What month? 0 points  What time? 0 points  Count back from 20 0 points  Months in reverse 0 points  Repeat phrase 0 points  Total Score 0 points    Immunizations Immunization History  Administered Date(s) Administered   Influenza Whole 01/03/2008   Influenza,inj,Quad PF,6+ Mos 11/14/2017   Influenza-Unspecified 12/21/2017, 12/02/2020, 12/04/2022   PFIZER(Purple Top)SARS-COV-2 Vaccination 05/22/2019, 06/09/2019, 11/24/2019   Pneumococcal Conjugate-13 02/20/2018   Pneumococcal Polysaccharide-23 12/11/2017   RSV,unspecified 02/22/2022   Td 12/27/2005   Tdap 03/25/2015    TDAP status: Up to date  Flu Vaccine status: Up to date  Pneumococcal vaccine status: Due, Education has been provided regarding the importance of this vaccine. Advised may receive this vaccine at local pharmacy or Health Dept. Aware to provide a copy of the vaccination record if obtained from local pharmacy or Health Dept. Verbalized acceptance and understanding.  Covid-19 vaccine status: Declined, Education has been provided regarding the importance of this vaccine but patient still declined. Advised may receive this vaccine at local pharmacy or Health Dept.or vaccine clinic. Aware to provide a copy of the vaccination record if obtained from local pharmacy or Health Dept. Verbalized acceptance and understanding.  Qualifies for Shingles Vaccine? Yes   Zostavax completed No   Shingrix Completed?: No.    Education has been provided regarding the importance of this vaccine. Patient has been advised to call insurance company to determine out of pocket expense if they have not yet received this vaccine. Advised may also receive vaccine at local pharmacy or Health Dept. Verbalized acceptance and understanding.  Screening Tests Health Maintenance  Topic Date Due   Pneumonia Vaccine 71+ Years old (3 of 3 - PPSV23 or PCV20)  12/12/2022   Medicare Annual Wellness (AWV)  12/05/2023  DTaP/Tdap/Td (3 - Td or Tdap) 03/24/2025   Colonoscopy  12/23/2031   INFLUENZA VACCINE  Completed   Hepatitis C Screening  Completed   HPV VACCINES  Aged Out   COVID-19 Vaccine  Discontinued   Zoster Vaccines- Shingrix  Discontinued    Health Maintenance  Health Maintenance Due  Topic Date Due   Pneumonia Vaccine 58+ Years old (3 of 3 - PPSV23 or PCV20) 12/12/2022    Colorectal cancer screening: Type of screening: Colonoscopy. Completed 12/22/2021. Repeat every 10 years  Lung Cancer Screening: (Low Dose CT Chest recommended if Age 65-80 years, 20 pack-year currently smoking OR have quit w/in 15years.) does not qualify.   Lung Cancer Screening Referral: NO  Additional Screening:  Hepatitis C Screening: does qualify; Completed 08/01/2016  Vision Screening: Recommended annual ophthalmology exams for early detection of glaucoma and other disorders of the eye. Is the patient up to date with their annual eye exam?  No  Who is the provider or what is the name of the office in which the patient attends annual eye exams? Scheduled for November 2024 for renewal of contacts. If pt is not established with a provider, would they like to be referred to a provider to establish care? No .   Dental Screening: Recommended annual dental exams for proper oral hygiene  Diabetic Foot Exam: N/A  Community Resource Referral / Chronic Care Management: CRR required this visit?  No   CCM required this visit?  No     Plan:     I have personally reviewed and noted the following in the patient's chart:   Medical and social history Use of alcohol, tobacco or illicit drugs  Current medications and supplements including opioid prescriptions. Patient is not currently taking opioid prescriptions. Functional ability and status Nutritional status Physical activity Advanced directives List of other physicians Hospitalizations, surgeries, and  ER visits in previous 12 months Vitals Screenings to include cognitive, depression, and falls Referrals and appointments  In addition, I have reviewed and discussed with patient certain preventive protocols, quality metrics, and best practice recommendations. A written personalized care plan for preventive services as well as general preventive health recommendations were provided to patient.     Mickeal Needy, LPN   16/11/9602   After Visit Summary: (Mail) Due to this being a telephonic visit, the after visit summary with patients personalized plan was offered to patient via mail   Nurse Notes: Normal cognitive status assessed by direct observation via telephone conversation by this Nurse Health Advisor. No abnormalities found.

## 2022-12-05 NOTE — Patient Instructions (Signed)
Mr. Frederick Scott , Thank you for taking time to come for your Medicare Wellness Visit. I appreciate your ongoing commitment to your health goals. Please review the following plan we discussed and let me know if I can assist you in the future.   Referrals/Orders/Follow-Ups/Clinician Recommendations: NO  This is a list of the screening recommended for you and due dates:  Health Maintenance  Topic Date Due   Pneumonia Vaccine (3 of 3 - PPSV23 or PCV20) 12/12/2022   Medicare Annual Wellness Visit  12/05/2023   DTaP/Tdap/Td vaccine (3 - Td or Tdap) 03/24/2025   Colon Cancer Screening  12/23/2031   Flu Shot  Completed   Hepatitis C Screening  Completed   HPV Vaccine  Aged Out   COVID-19 Vaccine  Discontinued   Zoster (Shingles) Vaccine  Discontinued    Advanced directives: (Declined) Advance directive discussed with you today. Even though you declined this today, please call our office should you change your mind, and we can give you the proper paperwork for you to fill out.  Next Medicare Annual Wellness Visit scheduled for next year: Yes

## 2022-12-29 DIAGNOSIS — H52223 Regular astigmatism, bilateral: Secondary | ICD-10-CM | POA: Diagnosis not present

## 2023-01-26 DIAGNOSIS — H524 Presbyopia: Secondary | ICD-10-CM | POA: Diagnosis not present

## 2023-03-01 ENCOUNTER — Ambulatory Visit: Payer: Self-pay | Admitting: Internal Medicine

## 2023-03-01 ENCOUNTER — Ambulatory Visit: Payer: Medicare Other | Admitting: Internal Medicine

## 2023-03-01 ENCOUNTER — Encounter: Payer: Self-pay | Admitting: Internal Medicine

## 2023-03-01 VITALS — BP 136/78 | HR 90 | Temp 98.4°F | Ht 72.0 in | Wt 152.8 lb

## 2023-03-01 DIAGNOSIS — J441 Chronic obstructive pulmonary disease with (acute) exacerbation: Secondary | ICD-10-CM | POA: Diagnosis not present

## 2023-03-01 MED ORDER — PREDNISONE 10 MG (21) PO TBPK
ORAL_TABLET | ORAL | 0 refills | Status: DC
Start: 2023-03-01 — End: 2023-06-04

## 2023-03-01 MED ORDER — PROMETHAZINE-DM 6.25-15 MG/5ML PO SYRP
5.0000 mL | ORAL_SOLUTION | Freq: Four times a day (QID) | ORAL | 0 refills | Status: AC | PRN
Start: 2023-03-01 — End: ?

## 2023-03-01 MED ORDER — METHYLPREDNISOLONE SODIUM SUCC 125 MG IJ SOLR
125.0000 mg | Freq: Once | INTRAMUSCULAR | Status: AC
Start: 2023-03-01 — End: 2023-03-01
  Administered 2023-03-01: 125 mg via INTRAMUSCULAR

## 2023-03-01 MED ORDER — DOXYCYCLINE HYCLATE 100 MG PO TABS
100.0000 mg | ORAL_TABLET | Freq: Two times a day (BID) | ORAL | 0 refills | Status: AC
Start: 2023-03-01 — End: 2023-03-08

## 2023-03-01 NOTE — Progress Notes (Signed)
 [facility] [address] [place] [address] Dept: [phone] Dept Fax: [phone]  Acute Care Office Visit  Subjective:   Frederick Scott. [birth year] 03/01/2023  Chief Complaint  Patient presents with   Sinusitis    Coughing spells, coughing up mucus started last Friday  Mucus relief tablet, muccinex,     HPI: Discussed the use of AI scribe software for clinical note transcription with the patient, who gave verbal consent to proceed.  History of Present Illness   The patient, with a history of emphysema, presents with a runny nose and worsening cough that began approximately a week ago. Initially, they experienced a runny nose with clear discharge, which they attributed to a common cold. Over the weekend, their symptoms progressed, and by Monday, they were producing green phlegm. They attempted self-medication with over-the-counter guaifenesin and Cepacol, which provided minimal relief. The patient reports severe coughing fits at night, to the point of struggling to breathe. They have been performing daily sinus washes, which usually clear their symptoms, but have not been effective this time. The patient denies fever, chest pain,  SHOB,  although they recall feeling a tight, sore sensation in their upper back at the onset of their symptoms, which has since resolved. They describe a possible wheezing sound when exhaling, particularly when lying down.         The following portions of the patient's history were reviewed and updated as appropriate: past medical history, past surgical history, family history, social history, allergies, medications, and problem list.   Patient Active Problem List   Diagnosis Date Noted   PAD (peripheral artery disease) (HCC) 09/20/2022   Lesion of left pinna 09/18/2022   Acute sinus infection 11/30/2021   Allergic rhinitis 11/30/2021   Goiter 08/01/2016   Skin lesion of face 08/01/2016    Smoker 08/01/2016   Lateral epicondylitis of right elbow 02/27/2015   Lower back pain 02/27/2015   Raynaud disease 02/26/2015   Aortic valve regurgitation 11/03/2011   Pulmonary emphysema (HCC) 11/03/2011   Hypothyroidism 11/03/2011   Erectile dysfunction 11/03/2011   Disorder of liver 10/17/2010   Encounter for well adult exam with abnormal findings 10/17/2010   THYROID  NODULE, LEFT 11/07/2007   Cough 11/07/2007   HYPERCHOLESTEROLEMIA 01/16/2007   CHEST PAIN, PLEURITIC 01/16/2007   Essential hypertension 09/14/2006   Past Medical History:  Diagnosis Date   Aortic valve regurgitation 11/03/2011   Mild, with normal EF Nov 2007 echo   CHEST PAIN, PLEURITIC 01/16/2007   Cough 11/07/2007   Emphysema 11/03/2011   Diffuse changes by CT chest 2005   Erectile dysfunction 11/03/2011   Eustachian tube dysfunction    Bilateral ear tubs for Eustachian Valve Dysfunction   History of scarlet fever    HYPERCHOLESTEROLEMIA 01/16/2007   Hyperglycemia    HYPERTENSION 09/14/2006   Hypothyroidism 11/03/2011   Raynaud's phenomenon    SMOKER 10/06/2008   THYROID  NODULE, LEFT 11/07/2007   Past Surgical History:  Procedure Laterality Date   NASAL SEPTUM SURGERY  1980   THYROID  LOBECTOMY  03/2005   left   Family History  Problem Relation Age of Onset   Cancer Neg Hx    Hypertension Father     Current Outpatient Medications:    amLODipine  (NORVASC ) 10 MG tablet, TAKE ONE TABLET BY MOUTH DAILY, Disp: 90 tablet, Rfl: 3   doxycycline  (VIBRA -TABS) 100 MG tablet, Take 1 tablet (100 mg total) by mouth 2 (two) times daily for 7 days., Disp:  14 tablet, Rfl: 0   fish oil-omega-3 fatty acids 1000 MG capsule, Take 1 capsule by mouth daily.  , Disp: , Rfl:    levothyroxine  (SYNTHROID ) 25 MCG tablet, Take 1 tablet (25 mcg total) by mouth daily before breakfast., Disp: 90 tablet, Rfl: 3   predniSONE  (STERAPRED UNI-PAK 21 TAB) 10 MG (21) TBPK tablet, Take as directed on packaging., Disp: 21 tablet, Rfl: 0    promethazine -dextromethorphan (PROMETHAZINE -DM) 6.25-15 MG/5ML syrup, Take 5 mLs by mouth 4 (four) times daily as needed for cough., Disp: 180 mL, Rfl: 0   sildenafil  (VIAGRA ) 100 MG tablet, Take 1 tablet (100 mg total) by mouth daily as needed., Disp: 12 tablet, Rfl: 11   aspirin  81 MG EC tablet, Take 1 tablet (81 mg total) by mouth daily. Swallow whole. (Patient not taking: Reported on 03/01/2023), Disp: 30 tablet, Rfl: 12   Multiple Vitamin (MULTIVITAMIN) tablet, Take 1 tablet by mouth daily.  , Disp: , Rfl:   Current Facility-Administered Medications:    methylPREDNISolone  sodium succinate (SOLU-MEDROL ) 125 mg/2 mL injection 125 mg, 125 mg, Intramuscular, Once,  Allergies  Allergen Reactions   Ace Inhibitors    Statins Other (See Comments)    Refusesdue to Family with problems with statins     ROS: A complete ROS was performed with pertinent positives/negatives noted in the HPI. The remainder of the ROS are negative.    Objective:   Today's Vitals   03/01/23 1412  BP: 136/78  Pulse: 90  Temp: 98.4 F (36.9 C)  TempSrc: Temporal  SpO2: 91%  Weight: 152 lb 12.8 oz (69.3 kg)  Height: 6' (1.829 m)    GENERAL: Well-appearing, in NAD. Well nourished.  SKIN: Pink, warm and dry. No rash.  HEENT:    HEAD: Normocephalic, non-traumatic.  EYES: Conjunctive pink without exudate.  EARS: External ear w/o redness, swelling, masses, or lesions. EAC clear. TM's intact, translucent w/o bulging, appropriate landmarks visualized.  NOSE: Septum midline w/o deformity. Nares patent, mucosa pink. Swollen right turbinate with clear drainage. No sinus tenderness.  THROAT: Uvula midline. Oropharynx clear. Tonsils non-inflamed w/o exudate. Mucus membranes pink and moist.  NECK: Trachea midline. Full ROM w/o pain or tenderness. No lymphadenopathy.  RESPIRATORY: Chest wall symmetrical. Respirations even and non-labored. Breath sounds clear to auscultation bilaterally. (+)cough CARDIAC: S1, S2 present,  regular rate and rhythm. Peripheral pulses 2+ bilaterally.  EXTREMITIES: Without clubbing, cyanosis, or edema.  NEUROLOGIC: Steady, even gait.  PSYCH/MENTAL STATUS: Alert, oriented x 3. Cooperative, appropriate mood and affect.    No results found for any visits on 03/01/23.    Assessment & Plan:  Assessment and Plan    Acute Exacerbation of COPD Symptoms started last Friday with a runny nose, progressing to productive cough with green phlegm, and nocturnal coughing fits. No fever or chest pain. Noted wheezing on exhalation. COVID and flu tests negative. -Administer Solu-Medrol  125mg  IM today. -Prescribe Doxycycline  100mg  BID for 7 days. -Prescribe steroid taper pack. -Prescribe Promethazine  DM cough syrup, up to 4 times a day as needed for cough. Patient advised of potential drowsiness. - CXR chest to rule out pneumonia -Follow up if symptoms worsen or fail to improve.      Meds ordered this encounter  Medications   doxycycline  (VIBRA -TABS) 100 MG tablet    Sig: Take 1 tablet (100 mg total) by mouth 2 (two) times daily for 7 days.    Dispense:  14 tablet    Refill:  0    Supervising Provider:  THOMPSON, AARON B [[id number]]   predniSONE  (STERAPRED UNI-PAK 21 TAB) 10 MG (21) TBPK tablet    Sig: Take as directed on packaging.    Dispense:  21 tablet    Refill:  0    Supervising Provider:   THOMPSON, AARON B [[id number]]   promethazine -dextromethorphan (PROMETHAZINE -DM) 6.25-15 MG/5ML syrup    Sig: Take 5 mLs by mouth 4 (four) times daily as needed for cough.    Dispense:  180 mL    Refill:  0    Supervising Provider:   THOMPSON, AARON B [[id number]]   methylPREDNISolone  sodium succinate (SOLU-MEDROL ) 125 mg/2 mL injection 125 mg   Orders Placed This Encounter  Procedures   DG Chest 2 View    Standing Status:   Future    Expiration Date:   02/29/2024    Preferred imaging location?:   [facility]    Reason for exam::   cough  x 1 week. COPD. Rule out pneumonia    Release  to patient:   Immediate   Lab Orders  No laboratory test(s) ordered today   No images are attached to the encounter or orders placed in the encounter.  Return if symptoms worsen or fail to improve.   Rosina Senters, FNP

## 2023-03-01 NOTE — Telephone Encounter (Signed)
 Copied from CRM [id number]. Topic: Clinical - Pink Word Triage >> Mar 01, 2023  9:04 AM Gerardine PARAS wrote: Patient/patient representative is calling to schedule an appointment. Stated he believe he has a sinus infection and is experiencing  green discharge out of nose, phlegm really dark and lots of coughing. Please call back at [phone]  Chief Complaint: Cough and sinus congestion Symptoms: Nasal discharge and productive cough Frequency: Since Saturday Pertinent Negatives: Patient denies relief Disposition: [] ED /[] Urgent Care (no appt availability in office) / [x] Appointment(In office/virtual)/ []  [facility]/ [] Home Care/ [] Refused Recommended Disposition /[] [facility]/ []  Follow-up with PCP Additional Notes: Patient called in complaining of sinus symptoms and cough that started on Saturday. Patient stated he has green nasal discharge and is coughing up green phlegm. Patient reported that his sinuses are not in a lot of pain at this time, but he stated he has been staying on top of flushing them out. Patient is mostly concerned about the productive cough and coughing spells he has at night. Patient stated that he has been taking Cepacol and OTC mucous relief, but denies relief. Patient denied fever and aches at this time. This RN advised patient to see a provider within 3 days. No availability at current PCP office. This RN scheduled patient for today at [place]. Patient complied.This RN advised patient that he could also try OTC cough syrup to help with his cough in the meantime.   Reason for Disposition  Lots of coughing  Answer Assessment - Initial Assessment Questions 1. LOCATION: Where does it hurt?      Patient denies pressure in sinus area, but has been flushing sinuses out  2. ONSET: When did the sinus pain start?  (e.g., hours, days)      This past Saturday  3. SEVERITY: How bad is the pain?   (Scale 1-10; mild, moderate or severe)   - MILD  (1-3): doesn't interfere with normal activities    - MODERATE (4-7): interferes with normal activities (e.g., work or school) or awakens from sleep   - SEVERE (8-10): excruciating pain and patient unable to do any normal activities        Patient states sinus pain is mild and not  his major concern at this time  4. RECURRENT SYMPTOM: Have you ever had sinus problems before? If Yes, ask: When was the last time? and What happened that time?      Patient states he has had sinus issues in the past, but flushing them out routinely has helped  5. NASAL CONGESTION: Is the nose blocked? If Yes, ask: Can you open it or must you breathe through your mouth?     Yes, but patient states he has been flushing sinuses out to help  6. NASAL DISCHARGE: Do you have discharge from your nose? If so ask, What color?     Yes, dark green to light green  7. FEVER: Do you have a fever? If Yes, ask: What is it, how was it measured, and when did it start?      Patient denies fever, but state he feels hot during coughing spells  8. OTHER SYMPTOMS: Do you have any other symptoms? (e.g., sore throat, cough, earache, difficulty breathing)     Coughing up dark green phlegm, coughing spells at night, patient feels hot during coughing spells, and body aches at beginning of week  Protocols used: Sinus Pain or Congestion-A-AH

## 2023-05-04 DIAGNOSIS — H18413 Arcus senilis, bilateral: Secondary | ICD-10-CM | POA: Diagnosis not present

## 2023-05-04 DIAGNOSIS — H25043 Posterior subcapsular polar age-related cataract, bilateral: Secondary | ICD-10-CM | POA: Diagnosis not present

## 2023-05-04 DIAGNOSIS — H2511 Age-related nuclear cataract, right eye: Secondary | ICD-10-CM | POA: Diagnosis not present

## 2023-05-04 DIAGNOSIS — H2513 Age-related nuclear cataract, bilateral: Secondary | ICD-10-CM | POA: Diagnosis not present

## 2023-05-04 DIAGNOSIS — H35371 Puckering of macula, right eye: Secondary | ICD-10-CM | POA: Diagnosis not present

## 2023-05-07 DIAGNOSIS — H2511 Age-related nuclear cataract, right eye: Secondary | ICD-10-CM | POA: Diagnosis not present

## 2023-05-08 DIAGNOSIS — H2512 Age-related nuclear cataract, left eye: Secondary | ICD-10-CM | POA: Diagnosis not present

## 2023-06-04 ENCOUNTER — Ambulatory Visit (INDEPENDENT_AMBULATORY_CARE_PROVIDER_SITE_OTHER)

## 2023-06-04 ENCOUNTER — Encounter: Payer: Self-pay | Admitting: Internal Medicine

## 2023-06-04 ENCOUNTER — Ambulatory Visit: Admitting: Internal Medicine

## 2023-06-04 VITALS — BP 136/74 | HR 85 | Temp 97.7°F | Ht 72.0 in | Wt 151.0 lb

## 2023-06-04 DIAGNOSIS — R051 Acute cough: Secondary | ICD-10-CM

## 2023-06-04 DIAGNOSIS — I10 Essential (primary) hypertension: Secondary | ICD-10-CM

## 2023-06-04 DIAGNOSIS — R062 Wheezing: Secondary | ICD-10-CM | POA: Diagnosis not present

## 2023-06-04 DIAGNOSIS — F17200 Nicotine dependence, unspecified, uncomplicated: Secondary | ICD-10-CM

## 2023-06-04 DIAGNOSIS — J439 Emphysema, unspecified: Secondary | ICD-10-CM | POA: Diagnosis not present

## 2023-06-04 DIAGNOSIS — R918 Other nonspecific abnormal finding of lung field: Secondary | ICD-10-CM | POA: Diagnosis not present

## 2023-06-04 DIAGNOSIS — R058 Other specified cough: Secondary | ICD-10-CM | POA: Diagnosis not present

## 2023-06-04 MED ORDER — PREDNISONE 10 MG PO TABS
ORAL_TABLET | ORAL | 0 refills | Status: DC
Start: 2023-06-04 — End: 2023-06-05

## 2023-06-04 MED ORDER — HYDROCODONE BIT-HOMATROP MBR 5-1.5 MG/5ML PO SOLN: 5.0000 mL | Freq: Four times a day (QID) | ORAL | 0 refills | Status: DC | PRN

## 2023-06-04 MED ORDER — LEVOFLOXACIN 500 MG PO TABS: 500.0000 mg | ORAL_TABLET | Freq: Every day | ORAL | 0 refills | Status: DC

## 2023-06-04 NOTE — Assessment & Plan Note (Signed)
 BP Readings from Last 3 Encounters:  06/04/23 136/74  03/01/23 136/78  09/18/22 136/82   Stable, pt to continue medical treatment norvasc 10 qd

## 2023-06-04 NOTE — Assessment & Plan Note (Signed)
 Pt counsled to quit, pt not ready

## 2023-06-04 NOTE — Progress Notes (Signed)
 Patient ID: Frederick Scott., male   DOB: May 15, 1955, 68 y.o.   MRN: [id number]        Chief Complaint: follow up cough wheezing x 3 days, smoker       HPI:  Frederick Scott. is a 68 y.o. male here with hx of emphysema unable to quit smoking but trying; here with similar episode as jan 2025.  Pt very hesitant to be labeled as copd and certainly does not want an inhaler, even though Here with acute onset mild to mod 2-3 days ST, HA, general weakness and malaise, with mild prod cough greenish yellow sputum, with mild sob and wheezing, but Pt denies chest pain, orthopnea, PND, increased LE swelling, palpitations, dizziness or syncope.   Pt denies polydipsia, polyuria, or new focal neuro s/s.   Does have several wks ongoing nasal allergy symptoms with clearish congestion, itch and sneezing, without fever, pain, ST, cough, swelling or wheezing.   Wt Readings from Last 3 Encounters:  06/04/23 151 lb (68.5 kg)  03/01/23 152 lb 12.8 oz (69.3 kg)  12/05/22 155 lb (70.3 kg)   BP Readings from Last 3 Encounters:  06/04/23 136/74  03/01/23 136/78  09/18/22 136/82         Past Medical History:  Diagnosis Date   Aortic valve regurgitation 11/03/2011   Mild, with normal EF Nov 2007 echo   CHEST PAIN, PLEURITIC 01/16/2007   Cough 11/07/2007   Emphysema 11/03/2011   Diffuse changes by CT chest 2005   Erectile dysfunction 11/03/2011   Eustachian tube dysfunction    Bilateral ear tubs for Eustachian Valve Dysfunction   History of scarlet fever    HYPERCHOLESTEROLEMIA 01/16/2007   Hyperglycemia    HYPERTENSION 09/14/2006   Hypothyroidism 11/03/2011   Raynaud's phenomenon    SMOKER 10/06/2008   THYROID NODULE, LEFT 11/07/2007   Past Surgical History:  Procedure Laterality Date   NASAL SEPTUM SURGERY  1980   THYROID LOBECTOMY  03/2005   left    reports that he has quit smoking. His smoking use included cigars. He has never used smokeless tobacco. He reports current alcohol use. He reports that he  does not use drugs. family history includes Hypertension in his father. Allergies  Allergen Reactions   Ace Inhibitors    Statins Other (See Comments)    Refusesdue to Family with problems with statins   Current Outpatient Medications on File Prior to Visit  Medication Sig Dispense Refill   amLODipine (NORVASC) 10 MG tablet TAKE ONE TABLET BY MOUTH DAILY 90 tablet 3   fish oil-omega-3 fatty acids 1000 MG capsule Take 1 capsule by mouth daily.       levothyroxine (SYNTHROID) 25 MCG tablet Take 1 tablet (25 mcg total) by mouth daily before breakfast. 90 tablet 3   Multiple Vitamin (MULTIVITAMIN) tablet Take 1 tablet by mouth daily.       promethazine-dextromethorphan (PROMETHAZINE-DM) 6.25-15 MG/5ML syrup Take 5 mLs by mouth 4 (four) times daily as needed for cough. 180 mL 0   sildenafil (VIAGRA) 100 MG tablet Take 1 tablet (100 mg total) by mouth daily as needed. 12 tablet 11   aspirin 81 MG EC tablet Take 1 tablet (81 mg total) by mouth daily. Swallow whole. (Patient not taking: Reported on 06/04/2023) 30 tablet 12   No current facility-administered medications on file prior to visit.        ROS:  All others reviewed and negative.  Objective  PE:  BP 136/74 (BP Location: Right Arm, Patient Position: Sitting, Cuff Size: Normal)   Pulse 85   Temp 97.7 F (36.5 C) (Oral)   Ht 6' (1.829 m)   Wt 151 lb (68.5 kg)   SpO2 91%   BMI 20.48 kg/m                 Constitutional: Pt appears in NAD, mild ill               HENT: Head: NCAT.                Right Ear: External ear normal.                 Left Ear: External ear normal. Bilat tm's with mild erythema.  Max sinus areas non tender.  Pharynx with mild erythema, no exudate               Eyes: . Pupils are equal, round, and reactive to light. Conjunctivae and EOM are normal               Nose: without d/c or deformity               Neck: Neck supple. Gross normal ROM               Cardiovascular: Normal rate and regular rhythm.                  Pulmonary/Chest: Effort normal and breath sounds decreased without rales but with few bilateral wheezing.                A               Neurological: Pt is alert. At baseline orientation, motor grossly intact               Skin: Skin is warm. No rashes, no other new lesions, LE edema - none               Psychiatric: Pt behavior is normal without agitation   Micro: none  Cardiac tracings I have personally interpreted today:  none  Pertinent Radiological findings (summarize): none   Lab Results  Component Value Date   WBC 8.4 09/18/2022   HGB 16.4 09/18/2022   HCT 50.8 09/18/2022   PLT 242.0 09/18/2022   GLUCOSE 104 (H) 09/18/2022   CHOL 224 (H) 09/18/2022   TRIG 104.0 09/18/2022   HDL 50.30 09/18/2022   LDLDIRECT 183.1 11/15/2012   LDLCALC 153 (H) 09/18/2022   ALT 14 09/18/2022   AST 20 09/18/2022   NA 139 09/18/2022   K 4.6 09/18/2022   CL 103 09/18/2022   CREATININE 0.93 09/18/2022   BUN 15 09/18/2022   CO2 28 09/18/2022   TSH 4.94 09/18/2022   PSA 0.65 09/18/2022   HGBA1C 5.6 09/18/2022   MICROALBUR <0.7 09/18/2022   Assessment/Plan:  Frederick Agena. is a 68 y.o. White or Caucasian [1] male with  has a past medical history of Aortic valve regurgitation (11/03/2011), CHEST PAIN, PLEURITIC (01/16/2007), Cough (11/07/2007), Emphysema (11/03/2011), Erectile dysfunction (11/03/2011), Eustachian tube dysfunction, History of scarlet fever, HYPERCHOLESTEROLEMIA (01/16/2007), Hyperglycemia, HYPERTENSION (09/14/2006), Hypothyroidism (11/03/2011), Raynaud's phenomenon, SMOKER (10/06/2008), and THYROID NODULE, LEFT (11/07/2007).  Cough Mild to mod, for cxr, antibx course levaquin 500 every day, cough med prn,,  to f/u any worsening symptoms or concerns  Wheezing Likely copd exacerbation but he is not wanting this diagnosis, for prednisone taper, declines inhaler,  to f/u any worsening symptoms or concerns  Smoker Pt counsled to quit, pt not ready  Essential  hypertension BP Readings from Last 3 Encounters:  06/04/23 136/74  03/01/23 136/78  09/18/22 136/82   Stable, pt to continue medical treatment norvasc 10 qd  Followup: Return if symptoms worsen or fail to improve.  Rosalia Colonel, MD 06/04/2023 8:03 PM [facility]

## 2023-06-04 NOTE — Assessment & Plan Note (Signed)
 Mild to mod, for cxr, antibx course levaquin 500 every day, cough med prn,,  to f/u any worsening symptoms or concerns

## 2023-06-04 NOTE — Patient Instructions (Addendum)
 Please take all new medication as prescribed- - the antibiotic, cough medicine, and prednisone  Please quit smoking  Please continue all other medications as before, and refills have been done if requested.  Please have the pharmacy call with any other refills you may need.  Please keep your appointments with your specialists as you may have planned  Please go to the XRAY Department in the first floor for the x-ray testing  You will be contacted by phone if any changes need to be made immediately.  Otherwise, you will receive a letter about your results with an explanation, but please check with MyChart first.  Please call if you change your mind about the inhaler

## 2023-06-04 NOTE — Assessment & Plan Note (Signed)
 Likely copd exacerbation but he is not wanting this diagnosis, for prednisone taper, declines inhaler,  to f/u any worsening symptoms or concerns

## 2023-06-05 ENCOUNTER — Telehealth: Payer: Self-pay | Admitting: Internal Medicine

## 2023-06-05 MED ORDER — LEVOFLOXACIN 500 MG PO TABS: 500.0000 mg | ORAL_TABLET | Freq: Every day | ORAL | 0 refills | Status: DC

## 2023-06-05 MED ORDER — PREDNISONE 10 MG PO TABS: ORAL_TABLET | ORAL | 0 refills | Status: DC

## 2023-06-05 MED ORDER — HYDROCODONE BIT-HOMATROP MBR 5-1.5 MG/5ML PO SOLN: 5.0000 mL | Freq: Four times a day (QID) | ORAL | 0 refills | Status: AC | PRN

## 2023-06-05 NOTE — Telephone Encounter (Signed)
 Copied from CRM [id number]. Topic: Clinical - Prescription Issue >> Jun 05, 2023  7:55 AM Frederick Scott wrote: Reason for CRM: Patient stated that he went to get his prescriptions yesterday and the pharmacist were not able to give him his prescription due to "[facility] license". Patient is requesting we follow up with pharmacy and patient.

## 2023-06-05 NOTE — Telephone Encounter (Signed)
 Copied from CRM [id number]. Topic: Clinical - Prescription Issue >> Jun 04, 2023  5:28 PM [place] J wrote: Reason for CRM: Portia Brittle a pharmacist from [facility] is calling in due to 3 scripts that were written by Dr. Autry Legions. When Portia Brittle tried to run the scripts through, it gave an error stating that the ordering provider is state restricted. He would like a call back at [phone]

## 2023-06-05 NOTE — Telephone Encounter (Signed)
 Unsure why msg sent to this coumadin clinic nurse. Pt does not and has not take warfarin in the past.

## 2023-06-15 ENCOUNTER — Encounter: Payer: Self-pay | Admitting: Internal Medicine

## 2023-08-14 ENCOUNTER — Encounter: Payer: Self-pay | Admitting: Internal Medicine

## 2023-08-14 ENCOUNTER — Ambulatory Visit: Admitting: Internal Medicine

## 2023-08-14 VITALS — BP 124/78 | HR 82 | Temp 98.3°F | Ht 72.0 in | Wt 146.0 lb

## 2023-08-14 DIAGNOSIS — I10 Essential (primary) hypertension: Secondary | ICD-10-CM

## 2023-08-14 DIAGNOSIS — C44229 Squamous cell carcinoma of skin of left ear and external auricular canal: Secondary | ICD-10-CM | POA: Insufficient documentation

## 2023-08-14 DIAGNOSIS — L814 Other melanin hyperpigmentation: Secondary | ICD-10-CM | POA: Insufficient documentation

## 2023-08-14 DIAGNOSIS — J441 Chronic obstructive pulmonary disease with (acute) exacerbation: Secondary | ICD-10-CM

## 2023-08-14 DIAGNOSIS — F17200 Nicotine dependence, unspecified, uncomplicated: Secondary | ICD-10-CM | POA: Diagnosis not present

## 2023-08-14 DIAGNOSIS — D225 Melanocytic nevi of trunk: Secondary | ICD-10-CM | POA: Insufficient documentation

## 2023-08-14 DIAGNOSIS — L570 Actinic keratosis: Secondary | ICD-10-CM | POA: Insufficient documentation

## 2023-08-14 DIAGNOSIS — J449 Chronic obstructive pulmonary disease, unspecified: Secondary | ICD-10-CM | POA: Insufficient documentation

## 2023-08-14 MED ORDER — METHYLPREDNISOLONE ACETATE 40 MG/ML IJ SUSP
40.0000 mg | Freq: Once | INTRAMUSCULAR | Status: AC
Start: 2023-08-14 — End: 2023-08-14
  Administered 2023-08-14: 40 mg via INTRAMUSCULAR

## 2023-08-14 MED ORDER — PREDNISONE 10 MG PO TABS: ORAL_TABLET | ORAL | 0 refills | Status: DC

## 2023-08-14 MED ORDER — METHYLPREDNISOLONE ACETATE 40 MG/ML IJ SUSP
40.0000 mg | Freq: Once | INTRAMUSCULAR | Status: AC
  Administered 2023-08-14: 40 mg via INTRAMUSCULAR

## 2023-08-14 MED ORDER — HYDROCODONE BIT-HOMATROP MBR 5-1.5 MG/5ML PO SOLN: 5.0000 mL | Freq: Four times a day (QID) | ORAL | 0 refills | Status: AC | PRN

## 2023-08-14 MED ORDER — AZITHROMYCIN 250 MG PO TABS: ORAL_TABLET | ORAL | 1 refills | Status: AC

## 2023-08-14 MED ORDER — TRELEGY ELLIPTA 100-62.5-25 MCG/ACT IN AEPB: 1.0000 | INHALATION_SPRAY | Freq: Every day | RESPIRATORY_TRACT | 11 refills | Status: DC

## 2023-08-14 MED ORDER — ALBUTEROL SULFATE HFA 108 (90 BASE) MCG/ACT IN AERS: 2.0000 | INHALATION_SPRAY | Freq: Four times a day (QID) | RESPIRATORY_TRACT | 11 refills | Status: AC | PRN

## 2023-08-14 NOTE — Assessment & Plan Note (Signed)
 Now former smoker,  to f/u any worsening symptoms or concerns

## 2023-08-14 NOTE — Assessment & Plan Note (Signed)
 BP Readings from Last 3 Encounters:  08/14/23 124/78  06/04/23 136/74  03/01/23 136/78   Stable, pt to continue medical treatment norvasc  10 qd

## 2023-08-14 NOTE — Patient Instructions (Signed)
 Please take all new medication as prescribed- the antibiotic, cough medicine, prednisone , albuterol hfa as needed, and Trelegy 1 inhaler per day if ok with insurance, to help reduce worsening symptoms coming back  You had the steroid shot today  Please continue all other medications as before, and refills have been done if requested.  Please have the pharmacy call with any other refills you may need.  Please keep your appointments with your specialists as you may have planned

## 2023-08-14 NOTE — Progress Notes (Signed)
 Patient ID: Frederick Lepera., male   DOB: Mar 09, 1955, 68 y.o.   MRN: [id number]        Chief Complaint: follow up allergies, copd exacerbation       HPI:  Frederick Catterton. is a 68 y.o. male here with c/o recurrent symptoms similar to feb 2025 with prod cough greenish sputum, wheezing mild sob doe, Pt denies chest pain, orthopnea, PND, increased LE swelling, palpitations, dizziness or syncope.   Has felt warm but no high fever, chills.   Pt denies polydipsia, polyuria, or new focal neuro s/s.    Pt denies fever, wt loss, night sweats, loss of appetite, or other constitutional symptoms   Recent CXR with copd changes.  Former smoker    Does have several wks ongoing nasal allergy symptoms with clearish congestion, itch and sneezing,  Wt Readings from Last 3 Encounters:  08/14/23 146 lb (66.2 kg)  06/04/23 151 lb (68.5 kg)  03/01/23 152 lb 12.8 oz (69.3 kg)   BP Readings from Last 3 Encounters:  08/14/23 124/78  06/04/23 136/74  03/01/23 136/78         Past Medical History:  Diagnosis Date   Aortic valve regurgitation 11/03/2011   Mild, with normal EF Nov 2007 echo   CHEST PAIN, PLEURITIC 01/16/2007   Cough 11/07/2007   Emphysema 11/03/2011   Diffuse changes by CT chest 2005   Erectile dysfunction 11/03/2011   Eustachian tube dysfunction    Bilateral ear tubs for Eustachian Valve Dysfunction   History of scarlet fever    HYPERCHOLESTEROLEMIA 01/16/2007   Hyperglycemia    HYPERTENSION 09/14/2006   Hypothyroidism 11/03/2011   Raynaud's phenomenon    SMOKER 10/06/2008   THYROID  NODULE, LEFT 11/07/2007   Past Surgical History:  Procedure Laterality Date   NASAL SEPTUM SURGERY  1980   THYROID  LOBECTOMY  03/2005   left    reports that he has quit smoking. His smoking use included cigars. He has never used smokeless tobacco. He reports current alcohol use. He reports that he does not use drugs. family history includes Hypertension in his father. Allergies  Allergen Reactions   Ace  Inhibitors    Statins Other (See Comments)    Refusesdue to Family with problems with statins   Current Outpatient Medications on File Prior to Visit  Medication Sig Dispense Refill   ketorolac (ACULAR) 0.5 % ophthalmic solution Place 1 drop into the right eye 4 (four) times daily.     moxifloxacin (VIGAMOX) 0.5 % ophthalmic solution Place 1 drop into the right eye 4 (four) times daily.     prednisoLONE acetate (PRED FORTE) 1 % ophthalmic suspension Place 1 drop into the right eye 4 (four) times daily.     amLODipine  (NORVASC ) 10 MG tablet TAKE ONE TABLET BY MOUTH DAILY 90 tablet 3   aspirin  81 MG EC tablet Take 1 tablet (81 mg total) by mouth daily. Swallow whole. (Patient not taking: Reported on 06/04/2023) 30 tablet 12   fish oil-omega-3 fatty acids 1000 MG capsule Take 1 capsule by mouth daily.       levothyroxine  (SYNTHROID ) 25 MCG tablet Take 1 tablet (25 mcg total) by mouth daily before breakfast. 90 tablet 3   Multiple Vitamin (MULTIVITAMIN) tablet Take 1 tablet by mouth daily.       promethazine -dextromethorphan (PROMETHAZINE -DM) 6.25-15 MG/5ML syrup Take 5 mLs by mouth 4 (four) times daily as needed for cough. (Patient not taking: Reported on 08/14/2023) 180 mL 0   sildenafil  (VIAGRA )  100 MG tablet Take 1 tablet (100 mg total) by mouth daily as needed. 12 tablet 11   No current facility-administered medications on file prior to visit.        ROS:  All others reviewed and negative.  Objective        PE:  BP 124/78 (BP Location: Left Arm, Patient Position: Sitting, Cuff Size: Normal)   Pulse 82   Temp 98.3 F (36.8 C) (Oral)   Ht 6' (1.829 m)   Wt 146 lb (66.2 kg)   SpO2 92%   BMI 19.80 kg/m                 Constitutional: Pt appears mild ill               HENT: Head: NCAT.                Right Ear: External ear normal.                 Left Ear: External ear normal.                Eyes: . Pupils are equal, round, and reactive to light. Conjunctivae and EOM are normal                Nose: without d/c or deformity               Neck: Neck supple. Gross normal ROM               Cardiovascular: Normal rate and regular rhythm.                 Pulmonary/Chest: Effort normal and breath sounds decreased without rales but with few mild wheezing.                               Neurological: Pt is alert. At baseline orientation, motor grossly intact               Skin: Skin is warm. No rashes, no other new lesions, LE edema - none               Psychiatric: Pt behavior is normal without agitation   Micro: none  Cardiac tracings I have personally interpreted today:  none  Pertinent Radiological findings (summarize): none   Lab Results  Component Value Date   WBC 8.4 09/18/2022   HGB 16.4 09/18/2022   HCT 50.8 09/18/2022   PLT 242.0 09/18/2022   GLUCOSE 104 (H) 09/18/2022   CHOL 224 (H) 09/18/2022   TRIG 104.0 09/18/2022   HDL 50.30 09/18/2022   LDLDIRECT 183.1 11/15/2012   LDLCALC 153 (H) 09/18/2022   ALT 14 09/18/2022   AST 20 09/18/2022   NA 139 09/18/2022   K 4.6 09/18/2022   CL 103 09/18/2022   CREATININE 0.93 09/18/2022   BUN 15 09/18/2022   CO2 28 09/18/2022   TSH 4.94 09/18/2022   PSA 0.65 09/18/2022   HGBA1C 5.6 09/18/2022   MICROALBUR <0.7 09/18/2022   Assessment/Plan:  Frederick Fendley. is a 68 y.o. White or Caucasian [1] male with  has a past medical history of Aortic valve regurgitation (11/03/2011), CHEST PAIN, PLEURITIC (01/16/2007), Cough (11/07/2007), Emphysema (11/03/2011), Erectile dysfunction (11/03/2011), Eustachian tube dysfunction, History of scarlet fever, HYPERCHOLESTEROLEMIA (01/16/2007), Hyperglycemia, HYPERTENSION (09/14/2006), Hypothyroidism (11/03/2011), Raynaud's phenomenon, SMOKER (10/06/2008), and THYROID  NODULE, LEFT (11/07/2007).  COPD (chronic obstructive pulmonary disease) (HCC) With now  provent recurrent symptoms - for zpack, cough med prn, depomedrol 80 mg IM, prednisone  taper, albuterol haf prn, and trelegy 1  qd  Smoker Now former smoker,  to f/u any worsening symptoms or concerns  Essential hypertension BP Readings from Last 3 Encounters:  08/14/23 124/78  06/04/23 136/74  03/01/23 136/78   Stable, pt to continue medical treatment norvasc  10 qd  Followup: Return in about 3 months (around 11/14/2023), or if symptoms worsen or fail to improve.  Frederick Rush, MD 08/14/2023 9:10 PM [facility] [place] [facility]

## 2023-08-14 NOTE — Assessment & Plan Note (Signed)
 With now provent recurrent symptoms - for zpack, cough med prn, depomedrol 80 mg IM, prednisone  taper, albuterol haf prn, and trelegy 1 qd

## 2023-08-28 ENCOUNTER — Encounter: Payer: Self-pay | Admitting: Internal Medicine

## 2023-08-29 ENCOUNTER — Encounter: Payer: Self-pay | Admitting: Internal Medicine

## 2023-08-29 ENCOUNTER — Ambulatory Visit: Admitting: Internal Medicine

## 2023-08-29 VITALS — BP 178/88 | HR 91 | Temp 98.5°F | Ht 72.0 in | Wt 145.0 lb

## 2023-08-29 DIAGNOSIS — F17200 Nicotine dependence, unspecified, uncomplicated: Secondary | ICD-10-CM | POA: Diagnosis not present

## 2023-08-29 DIAGNOSIS — I10 Essential (primary) hypertension: Secondary | ICD-10-CM | POA: Diagnosis not present

## 2023-08-29 DIAGNOSIS — J441 Chronic obstructive pulmonary disease with (acute) exacerbation: Secondary | ICD-10-CM | POA: Diagnosis not present

## 2023-08-29 DIAGNOSIS — I351 Nonrheumatic aortic (valve) insufficiency: Secondary | ICD-10-CM

## 2023-08-29 MED ORDER — LEVOFLOXACIN 500 MG PO TABS: 500.0000 mg | ORAL_TABLET | Freq: Every day | ORAL | 0 refills | Status: AC

## 2023-08-29 MED ORDER — PREDNISONE 10 MG PO TABS: ORAL_TABLET | ORAL | 0 refills | Status: AC

## 2023-08-29 NOTE — Assessment & Plan Note (Signed)
 Pt counsled to quit, pt not ready

## 2023-08-29 NOTE — Assessment & Plan Note (Addendum)
 Recurrent, for levaquin  500 mg every day course, prednisone  taper, and has cough med prn at home ; to continue albuterol , but refuses airsupra or breztri or symbicort due to cost he fears;d  also for pulmonary referral he will now accept

## 2023-08-29 NOTE — Patient Instructions (Signed)
 Please take all new medication as prescribed  - the antibiotic, and prednisone   Please let us  know if you change your mind about the generic Symbicort  Please continue all other medications as before, and refills have been done if requested.  Please have the pharmacy call with any other refills you may need.  Please keep your appointments with your specialists as you may have planned  You will be contacted regarding the referral for: Pulmonary

## 2023-08-29 NOTE — Assessment & Plan Note (Signed)
Volume stable, cont current med tx

## 2023-08-29 NOTE — Progress Notes (Signed)
 Patient ID: Frederick Howk., male   DOB: [birth year], 68 y.o.   MRN: [id number]        Chief Complaint: follow up copd exacerbation       HPI:  Frederick Morten. is a 68 y.o. male here with c/o recurrent exacerbation of prod cough worst in the AM, greenish, with mild sob wheezing better with albuterol  hfa.  Lamenting he has had several exacerbations already this year and really thinks he needs a sputum culture since a friend told him this.  Never started the trelegy due to cost.  Last episode zpack worked well, but now back to where he started.  April 2025 cxr - neg for acute, but with bronchitic changes and emphysema.  Still smoking, not ready to quit.       Wt Readings from Last 3 Encounters:  08/29/23 145 lb (65.8 kg)  08/14/23 146 lb (66.2 kg)  06/04/23 151 lb (68.5 kg)   BP Readings from Last 3 Encounters:  08/29/23 (!) 178/88  08/14/23 124/78  06/04/23 136/74         Past Medical History:  Diagnosis Date   Aortic valve regurgitation 11/03/2011   Mild, with normal EF Nov 2007 echo   CHEST PAIN, PLEURITIC 01/16/2007   Cough 11/07/2007   Emphysema 11/03/2011   Diffuse changes by CT chest 2005   Erectile dysfunction 11/03/2011   Eustachian tube dysfunction    Bilateral ear tubs for Eustachian Valve Dysfunction   History of scarlet fever    HYPERCHOLESTEROLEMIA 01/16/2007   Hyperglycemia    HYPERTENSION 09/14/2006   Hypothyroidism 11/03/2011   Raynaud's phenomenon    SMOKER 10/06/2008   THYROID  NODULE, LEFT 11/07/2007   Past Surgical History:  Procedure Laterality Date   NASAL SEPTUM SURGERY  1980   THYROID  LOBECTOMY  03/2005   left    reports that he has quit smoking. His smoking use included cigars. He has never used smokeless tobacco. He reports current alcohol use. He reports that he does not use drugs. family history includes Hypertension in his father. Allergies  Allergen Reactions   Ace Inhibitors    Statins Other (See Comments)    Refusesdue to Family with  problems with statins   Current Outpatient Medications on File Prior to Visit  Medication Sig Dispense Refill   amLODipine  (NORVASC ) 10 MG tablet TAKE ONE TABLET BY MOUTH DAILY 90 tablet 3   fish oil-omega-3 fatty acids 1000 MG capsule Take 1 capsule by mouth daily.       ketorolac (ACULAR) 0.5 % ophthalmic solution Place 1 drop into the right eye 4 (four) times daily.     levothyroxine  (SYNTHROID ) 25 MCG tablet Take 1 tablet (25 mcg total) by mouth daily before breakfast. 90 tablet 3   moxifloxacin (VIGAMOX) 0.5 % ophthalmic solution Place 1 drop into the right eye 4 (four) times daily.     Multiple Vitamin (MULTIVITAMIN) tablet Take 1 tablet by mouth daily.       prednisoLONE acetate (PRED FORTE) 1 % ophthalmic suspension Place 1 drop into the right eye 4 (four) times daily.     sildenafil  (VIAGRA ) 100 MG tablet Take 1 tablet (100 mg total) by mouth daily as needed. 12 tablet 11   albuterol  (VENTOLIN  HFA) 108 (90 Base) MCG/ACT inhaler Inhale 2 puffs into the lungs every 6 (six) hours as needed for wheezing or shortness of breath. (Patient not taking: Reported on 08/29/2023) 8 g 11   aspirin  81 MG EC tablet Take  1 tablet (81 mg total) by mouth daily. Swallow whole. (Patient not taking: Reported on 08/29/2023) 30 tablet 12   promethazine -dextromethorphan (PROMETHAZINE -DM) 6.25-15 MG/5ML syrup Take 5 mLs by mouth 4 (four) times daily as needed for cough. (Patient not taking: Reported on 08/29/2023) 180 mL 0   No current facility-administered medications on file prior to visit.        ROS:  All others reviewed and negative.  Objective        PE:  BP (!) 178/88   Pulse 91   Temp 98.5 F (36.9 C)   Ht 6' (1.829 m)   Wt 145 lb (65.8 kg)   SpO2 94%   BMI 19.67 kg/m                 Constitutional: Pt appears in NAD               HENT: Head: NCAT.                Right Ear: External ear normal.                 Left Ear: External ear normal.                Eyes: . Pupils are equal, round, and  reactive to light. Conjunctivae and EOM are normal               Nose: without d/c or deformity               Neck: Neck supple. Gross normal ROM               Cardiovascular: Normal rate and regular rhythm.                 Pulmonary/Chest: Effort normal and breath sounds without rales or wheezing.                Abd:  Soft, NT, ND, + BS, no organomegaly               Neurological: Pt is alert. At baseline orientation, motor grossly intact               Skin: Skin is warm. No rashes, no other new lesions, LE edema - none               Psychiatric: Pt behavior is normal without agitation   Micro: none  Cardiac tracings I have personally interpreted today:  none  Pertinent Radiological findings (summarize): none   Lab Results  Component Value Date   WBC 8.4 09/18/2022   HGB 16.4 09/18/2022   HCT 50.8 09/18/2022   PLT 242.0 09/18/2022   GLUCOSE 104 (H) 09/18/2022   CHOL 224 (H) 09/18/2022   TRIG 104.0 09/18/2022   HDL 50.30 09/18/2022   LDLDIRECT 183.1 11/15/2012   LDLCALC 153 (H) 09/18/2022   ALT 14 09/18/2022   AST 20 09/18/2022   NA 139 09/18/2022   K 4.6 09/18/2022   CL 103 09/18/2022   CREATININE 0.93 09/18/2022   BUN 15 09/18/2022   CO2 28 09/18/2022   TSH 4.94 09/18/2022   PSA 0.65 09/18/2022   HGBA1C 5.6 09/18/2022   Assessment/Plan:  Frederick Musa. is a 68 y.o. White or Caucasian [1] male with  has a past medical history of Aortic valve regurgitation (11/03/2011), CHEST PAIN, PLEURITIC (01/16/2007), Cough (11/07/2007), Emphysema (11/03/2011), Erectile dysfunction (11/03/2011), Eustachian tube dysfunction, History of scarlet fever, HYPERCHOLESTEROLEMIA (01/16/2007), Hyperglycemia, HYPERTENSION (09/14/2006),  Hypothyroidism (11/03/2011), Raynaud's phenomenon, SMOKER (10/06/2008), and THYROID  NODULE, LEFT (11/07/2007).  COPD exacerbation (HCC) Recurrent, for levaquin  500 mg every day course, prednisone  taper, and has cough med prn at home ; to continue albuterol , but  refuses airsupra or breztri or symbicort due to cost he fears;d  also for pulmonary referral he will now accept  Smoker Pt counsled to quit, pt not ready  Essential hypertension BP Readings from Last 3 Encounters:  08/29/23 (!) 178/88  08/14/23 124/78  06/04/23 136/74   Uncontrolled, likely reactive,, pt to continue medical treatment norvasc  10 qd   Aortic valve regurgitation Volume stable, cont current med tx  Followup: Return if symptoms worsen or fail to improve.  Frederick Rush, MD 08/29/2023 7:01 PM [facility]

## 2023-08-29 NOTE — Assessment & Plan Note (Signed)
 BP Readings from Last 3 Encounters:  08/29/23 (!) 178/88  08/14/23 124/78  06/04/23 136/74   Uncontrolled, likely reactive,, pt to continue medical treatment norvasc  10 qd

## 2023-09-12 ENCOUNTER — Other Ambulatory Visit: Payer: Self-pay | Admitting: Internal Medicine

## 2023-10-11 DIAGNOSIS — M79645 Pain in left finger(s): Secondary | ICD-10-CM | POA: Diagnosis not present

## 2023-10-11 DIAGNOSIS — S61211A Laceration without foreign body of left index finger without damage to nail, initial encounter: Secondary | ICD-10-CM | POA: Diagnosis not present

## 2023-12-06 ENCOUNTER — Ambulatory Visit: Payer: Medicare Other

## 2023-12-06 VITALS — Ht 72.0 in | Wt 145.0 lb

## 2023-12-06 DIAGNOSIS — Z0000 Encounter for general adult medical examination without abnormal findings: Secondary | ICD-10-CM

## 2023-12-06 NOTE — Patient Instructions (Signed)
 Frederick Scott,  Thank you for taking the time for your Medicare Wellness Visit. I appreciate your continued commitment to your health goals. Please review the care plan we discussed, and feel free to reach out if I can assist you further.  Medicare recommends these wellness visits once per year to help you and your care team stay ahead of potential health issues. These visits are designed to focus on prevention, allowing your provider to concentrate on managing your acute and chronic conditions during your regular appointments.  Please note that Annual Wellness Visits do not include a physical exam. Some assessments may be limited, especially if the visit was conducted virtually. If needed, we may recommend a separate in-person follow-up with your provider.  Ongoing Care Seeing your primary care provider every 3 to 6 months helps us  monitor your health and provide consistent, personalized care. Last office visit on 08/29/2023.  You are due for a pneumonia vaccine and flu vaccine, which can be done at local pharmacy.  Referrals If a referral was made during today's visit and you haven't received any updates within two weeks, please contact the referred provider directly to check on the status.  Recommended Screenings:  Health Maintenance  Topic Date Due   Pneumococcal Vaccine for age over 52 (3 of 3 - PCV20 or PCV21) 02/21/2023   Flu Shot  09/21/2023   Medicare Annual Wellness Visit  12/05/2024   DTaP/Tdap/Td vaccine (3 - Td or Tdap) 03/24/2025   Colon Cancer Screening  12/23/2031   Hepatitis C Screening  Completed   Meningitis B Vaccine  Aged Out   COVID-19 Vaccine  Discontinued   Zoster (Shingles) Vaccine  Discontinued       12/06/2023    1:55 PM  Advanced Directives  Does Patient Have a Medical Advance Directive? Yes  Type of Advance Directive Living will  Copy of Healthcare Power of Attorney in Chart? No - copy requested   Advance Care Planning is important because it: Ensures  you receive medical care that aligns with your values, goals, and preferences. Provides guidance to your family and loved ones, reducing the emotional burden of decision-making during critical moments.  Vision: Annual vision screenings are recommended for early detection of glaucoma, cataracts, and diabetic retinopathy. These exams can also reveal signs of chronic conditions such as diabetes and high blood pressure.  Dental: Annual dental screenings help detect early signs of oral cancer, gum disease, and other conditions linked to overall health, including heart disease and diabetes.  Please see the attached documents for additional preventive care recommendations.

## 2023-12-06 NOTE — Progress Notes (Signed)
 Subjective:   Frederick Scott. is a 68 y.o. who presents for a Medicare Wellness preventive visit.  As a reminder, Annual Wellness Visits don't include a physical exam, and some assessments may be limited, especially if this visit is performed virtually. We may recommend an in-person follow-up visit with your provider if needed.  Visit Complete: Virtual I connected with  Beryl JAYSON [name] [name]. on 12/06/23 by a audio enabled telemedicine application and verified that I am speaking with the correct person using two identifiers.  Patient Location: Home  Provider Location: Office/Clinic  I discussed the limitations of evaluation and management by telemedicine. The patient expressed understanding and agreed to proceed.  Vital Signs: Because this visit was a virtual/telehealth visit, some criteria may be missing or patient reported. Any vitals not documented were not able to be obtained and vitals that have been documented are patient reported.  VideoDeclined- This patient declined Librarian, academic. Therefore the visit was completed with audio only.  Persons Participating in Visit: Patient.  AWV Questionnaire: No: Patient Medicare AWV questionnaire was not completed prior to this visit.  Cardiac Risk Factors include: advanced age (>57men, >50 women);hypertension;male gender;Other (see comment), Risk factor comments: PAD     Objective:    Today's Vitals   12/06/23 1345  Weight: 145 lb (65.8 kg)  Height: 6' (1.829 m)   Body mass index is 19.67 kg/m.     12/06/2023    1:55 PM 12/05/2022    1:36 PM  Advanced Directives  Does Patient Have a Medical Advance Directive? Yes No  Type of Advance Directive Living will   Copy of Healthcare Power of Attorney in Chart? No - copy requested   Would patient like information on creating a medical advance directive?  No - Patient declined    Current Medications (verified) Outpatient Encounter Medications as of  12/06/2023  Medication Sig   amLODipine  (NORVASC ) 10 MG tablet TAKE 1 TABLET BY MOUTH DAILY   fish oil-omega-3 fatty acids 1000 MG capsule Take 1 capsule by mouth daily.     ketorolac (ACULAR) 0.5 % ophthalmic solution Place 1 drop into the right eye 4 (four) times daily.   levofloxacin  (LEVAQUIN ) 500 MG tablet Take 1 tablet (500 mg total) by mouth daily.   levothyroxine  (SYNTHROID ) 25 MCG tablet TAKE 1 TABLET(25 MCG) BY MOUTH DAILY BEFORE BREAKFAST   moxifloxacin (VIGAMOX) 0.5 % ophthalmic solution Place 1 drop into the right eye 4 (four) times daily.   Multiple Vitamin (MULTIVITAMIN) tablet Take 1 tablet by mouth daily.     prednisoLONE acetate (PRED FORTE) 1 % ophthalmic suspension Place 1 drop into the right eye 4 (four) times daily.   predniSONE  (DELTASONE ) 10 MG tablet 3 tabs by mouth per day for 3 days,2tabs per day for 3 days,1tab per day for 3 days   sildenafil  (VIAGRA ) 100 MG tablet Take 1 tablet (100 mg total) by mouth daily as needed.   albuterol  (VENTOLIN  HFA) 108 (90 Base) MCG/ACT inhaler Inhale 2 puffs into the lungs every 6 (six) hours as needed for wheezing or shortness of breath. (Patient not taking: Reported on 12/06/2023)   aspirin  81 MG EC tablet Take 1 tablet (81 mg total) by mouth daily. Swallow whole. (Patient not taking: Reported on 12/06/2023)   promethazine -dextromethorphan (PROMETHAZINE -DM) 6.25-15 MG/5ML syrup Take 5 mLs by mouth 4 (four) times daily as needed for cough. (Patient not taking: Reported on 12/06/2023)   No facility-administered encounter medications on file as of 12/06/2023.  Allergies (verified) Ace inhibitors and Statins   History: Past Medical History:  Diagnosis Date   Aortic valve regurgitation 11/03/2011   Mild, with normal EF Nov 2007 echo   CHEST PAIN, PLEURITIC 01/16/2007   Cough 11/07/2007   Emphysema 11/03/2011   Diffuse changes by CT chest 2005   Erectile dysfunction 11/03/2011   Eustachian tube dysfunction    Bilateral ear tubs  for Eustachian Valve Dysfunction   History of scarlet fever    HYPERCHOLESTEROLEMIA 01/16/2007   Hyperglycemia    HYPERTENSION 09/14/2006   Hypothyroidism 11/03/2011   Raynaud's phenomenon    SMOKER 10/06/2008   THYROID  NODULE, LEFT 11/07/2007   Past Surgical History:  Procedure Laterality Date   NASAL SEPTUM SURGERY  1980   THYROID  LOBECTOMY  03/2005   left   Family History  Problem Relation Age of Onset   Cancer Neg Hx    Hypertension Father    Social History   Socioeconomic History   Marital status: Single    Spouse name: Not on file   Number of children: Not on file   Years of education: Not on file   Highest education level: Not on file  Occupational History   Occupation: Unemployed   Occupation: RETIRED  Tobacco Use   Smoking status: Former    Types: Cigars   Smokeless tobacco: Never  Vaping Use   Vaping status: Never Used  Substance and Sexual Activity   Alcohol use: Yes    Comment: 1-4/weekend   Drug use: No   Sexual activity: Not on file  Other Topics Concern   Not on file  Social History Narrative   Divorced   Lives alone/2025   Social Drivers of Health   Financial Resource Strain: Low Risk  (12/06/2023)   Overall Financial Resource Strain (CARDIA)    Difficulty of Paying Living Expenses: Not very hard  Food Insecurity: No Food Insecurity (12/06/2023)   Hunger Vital Sign    Worried About Running Out of Food in the Last Year: Never true    Ran Out of Food in the Last Year: Never true  Transportation Needs: No Transportation Needs (12/06/2023)   PRAPARE - Administrator, Civil Service (Medical): No    Lack of Transportation (Non-Medical): No  Physical Activity: Insufficiently Active (12/06/2023)   Exercise Vital Sign    Days of Exercise per Week: 4 days    Minutes of Exercise per Session: 20 min  Stress: No Stress Concern Present (12/06/2023)   [employer] of Occupational Health - Occupational Stress Questionnaire    Feeling of  Stress: Not at all  Social Connections: Socially Isolated (12/06/2023)   Social Connection and Isolation Panel    Frequency of Communication with Friends and Family: More than three times a week    Frequency of Social Gatherings with Friends and Family: More than three times a week    Attends Religious Services: Never    Database administrator or Organizations: No    Attends Engineer, structural: Never    Marital Status: Divorced    Tobacco Counseling Counseling given: Not Answered    Clinical Intake:  Pre-visit preparation completed: Yes  Pain : No/denies pain     BMI - recorded: 19.67 Nutritional Status: BMI of 19-24  Normal Nutritional Risks: None Diabetes: No  Lab Results  Component Value Date   HGBA1C 5.6 09/18/2022   HGBA1C 5.7 03/27/2006     How often do you need to have someone help you  when you read instructions, pamphlets, or other written materials from your doctor or pharmacy?: 1 - Never  Interpreter Needed?: No  Information entered by :: Solae Norling, RMA   Activities of Daily Living     12/06/2023    1:50 PM  In your present state of health, do you have any difficulty performing the following activities:  Hearing? 0  Vision? 0  Difficulty concentrating or making decisions? 0  Walking or climbing stairs? 0  Dressing or bathing? 0  Doing errands, shopping? 0  Preparing Food and eating ? N  Using the Toilet? N  In the past six months, have you accidently leaked urine? N  Do you have problems with loss of bowel control? N  Managing your Medications? N  Managing your Finances? N  Housekeeping or managing your Housekeeping? N    Patient Care Team: Norleen Lynwood ORN, MD as PCP - General (Internal Medicine)  I have updated your Care Teams any recent Medical Services you may have received from other providers in the past year.     Assessment:   This is a routine wellness examination for [place].  Hearing/Vision screen Hearing Screening  - Comments:: Denies hearing difficulties   Vision Screening - Comments:: Wear a contact lens in left eye/ [facility]   Goals Addressed             This Visit's Progress    Patient Stated       Not at this time/2025       Depression Screen     12/06/2023    1:59 PM 08/14/2023    2:19 PM 06/04/2023    3:19 PM 03/01/2023    2:10 PM 12/05/2022    1:38 PM 09/18/2022   10:03 AM 09/15/2021   10:09 AM  PHQ 2/9 Scores  PHQ - 2 Score 0 0 0 0 0 0 1  PHQ- 9 Score 0    0      Fall Risk     12/06/2023    1:56 PM 08/14/2023    2:31 PM 06/04/2023    3:37 PM 03/01/2023    2:10 PM 12/05/2022    1:37 PM  Fall Risk   Falls in the past year? 0 0 0 0 0  Number falls in past yr: 0 0 0 0 0  Injury with Fall? 0 0 0 0 0  Risk for fall due to :  No Fall Risks No Fall Risks No Fall Risks No Fall Risks  Follow up Falls evaluation completed;Follow up appointment Falls evaluation completed Falls evaluation completed Falls prevention discussed Falls prevention discussed    MEDICARE RISK AT HOME:  Medicare Risk at Home Any stairs in or around the home?: Yes (basement and 2 story home) If so, are there any without handrails?: No Home free of loose throw rugs in walkways, pet beds, electrical cords, etc?: Yes Adequate lighting in your home to reduce risk of falls?: Yes Life alert?: No Use of a cane, walker or w/c?: No Grab bars in the bathroom?: No Shower chair or bench in shower?: No Elevated toilet seat or a handicapped toilet?: No  TIMED UP AND GO:  Was the test performed?  No  Cognitive Function: Declined/Normal: No cognitive concerns noted by patient or family. Patient alert, oriented, able to answer questions appropriately and recall recent events. No signs of memory loss or confusion.    12/05/2022    1:40 PM  MMSE - Mini Mental State Exam  Not  completed: Unable to complete        12/05/2022    1:40 PM  6CIT Screen  What Year? 0 points  What month? 0 points  What  time? 0 points  Count back from 20 0 points  Months in reverse 0 points  Repeat phrase 0 points  Total Score 0 points    Immunizations Immunization History  Administered Date(s) Administered   Influenza Whole 01/03/2008   Influenza,inj,Quad PF,6+ Mos 11/14/2017   Influenza-Unspecified 12/21/2017, 12/02/2020, 12/04/2022   PFIZER(Purple Top)SARS-COV-2 Vaccination 05/22/2019, 06/09/2019, 11/24/2019   Pneumococcal Conjugate-13 12/08/2016, 02/20/2018   Pneumococcal Polysaccharide-23 12/11/2017   RSV,unspecified 02/22/2022   Td 12/27/2005   Tdap 03/25/2015    Screening Tests Health Maintenance  Topic Date Due   Pneumococcal Vaccine: 50+ Years (3 of 3 - PCV20 or PCV21) 02/21/2023   Influenza Vaccine  09/21/2023   Medicare Annual Wellness (AWV)  12/05/2024   DTaP/Tdap/Td (3 - Td or Tdap) 03/24/2025   Colonoscopy  12/23/2031   Hepatitis C Screening  Completed   Meningococcal B Vaccine  Aged Out   COVID-19 Vaccine  Discontinued   Zoster Vaccines- Shingrix  Discontinued    Health Maintenance Items Addressed: Vaccines Due: Flu, See Nurse Notes at the end of this note  Additional Screening:  Vision Screening: Recommended annual ophthalmology exams for early detection of glaucoma and other disorders of the eye. Is the patient up to date with their annual eye exam?  Yes  Who is the provider or what is the name of the office in which the patient attends annual eye exams? [facility]/[place]-per pt-up to date  Dental Screening: Recommended annual dental exams for proper oral hygiene  Community Resource Referral / Chronic Care Management: CRR required this visit?  No   CCM required this visit?  No   Plan:    I have personally reviewed and noted the following in the patient's chart:   Medical and social history Use of alcohol, tobacco or illicit drugs  Current medications and supplements including opioid prescriptions. Patient is not currently taking opioid  prescriptions. Functional ability and status Nutritional status Physical activity Advanced directives List of other physicians Hospitalizations, surgeries, and ER visits in previous 12 months Vitals Screenings to include cognitive, depression, and falls Referrals and appointments  In addition, I have reviewed and discussed with patient certain preventive protocols, quality metrics, and best practice recommendations. A written personalized care plan for preventive services as well as general preventive health recommendations were provided to patient.   Kitai Purdom L Skyley Grandmaison, CMA   12/06/2023   After Visit Summary: (MyChart) Due to this being a telephonic visit, the after visit summary with patients personalized plan was offered to patient via MyChart   Notes: Patient is due for a flu and pneumonia vaccine.  He had no other concerns to address today.

## 2024-01-01 DIAGNOSIS — Z1211 Encounter for screening for malignant neoplasm of colon: Secondary | ICD-10-CM | POA: Diagnosis not present

## 2024-01-01 DIAGNOSIS — Z09 Encounter for follow-up examination after completed treatment for conditions other than malignant neoplasm: Secondary | ICD-10-CM | POA: Diagnosis not present

## 2024-01-01 DIAGNOSIS — D125 Benign neoplasm of sigmoid colon: Secondary | ICD-10-CM | POA: Diagnosis not present

## 2024-01-01 DIAGNOSIS — K635 Polyp of colon: Secondary | ICD-10-CM | POA: Diagnosis not present

## 2024-01-01 DIAGNOSIS — D123 Benign neoplasm of transverse colon: Secondary | ICD-10-CM | POA: Diagnosis not present

## 2024-03-11 ENCOUNTER — Encounter: Payer: Self-pay | Admitting: *Deleted

## 2024-03-11 NOTE — Progress Notes (Signed)
 Frederick Scott [name] [name].                                          MRN: [id number]   03/11/2024   The [facility] Quality Team Specialist reviewed this patient medical record for the purposes of chart review for care gap closure. The following were reviewed: chart review for care gap closure-controlling blood pressure.    [facility] Quality Team
